# Patient Record
Sex: Female | Born: 1940
Health system: Southern US, Community
[De-identification: ages and names within clinical notes are randomized; demographics above are authoritative.]

---

## 2004-02-03 ENCOUNTER — Inpatient Hospital Stay (HOSPITAL_COMMUNITY): Admission: AC | Admit: 2004-02-03 | Discharge: 2004-02-09 | Payer: Self-pay

## 2004-02-08 ENCOUNTER — Ambulatory Visit: Payer: Self-pay | Admitting: Physical Medicine & Rehabilitation

## 2004-02-09 ENCOUNTER — Inpatient Hospital Stay: Admission: RE | Admit: 2004-02-09 | Discharge: 2004-02-18 | Payer: Self-pay | Admitting: *Deleted

## 2004-02-11 ENCOUNTER — Ambulatory Visit (HOSPITAL_COMMUNITY): Admission: RE | Admit: 2004-02-11 | Discharge: 2004-02-11 | Payer: Self-pay | Admitting: *Deleted

## 2004-04-10 ENCOUNTER — Ambulatory Visit (HOSPITAL_BASED_OUTPATIENT_CLINIC_OR_DEPARTMENT_OTHER): Admission: RE | Admit: 2004-04-10 | Discharge: 2004-04-10 | Payer: Self-pay | Admitting: Specialist

## 2006-10-09 IMAGING — CR DG CHEST 1V PORT
1 series · 1 of 1 positions shown · non-contrast
Comparison: none

CLINICAL DATA: Rib fracture.  
 PORTABLE CHEST ? 02/04/2004 ([DATE] HOURS)
 The heart is normal in size.  The lungs are under inflated.  Patchy airspace disease is seen throughout the left lung and right base versus atelectasis.  No pneumothoraces or effusions are seen.  A right 1st rib fracture is not appreciated.

[view not recorded]
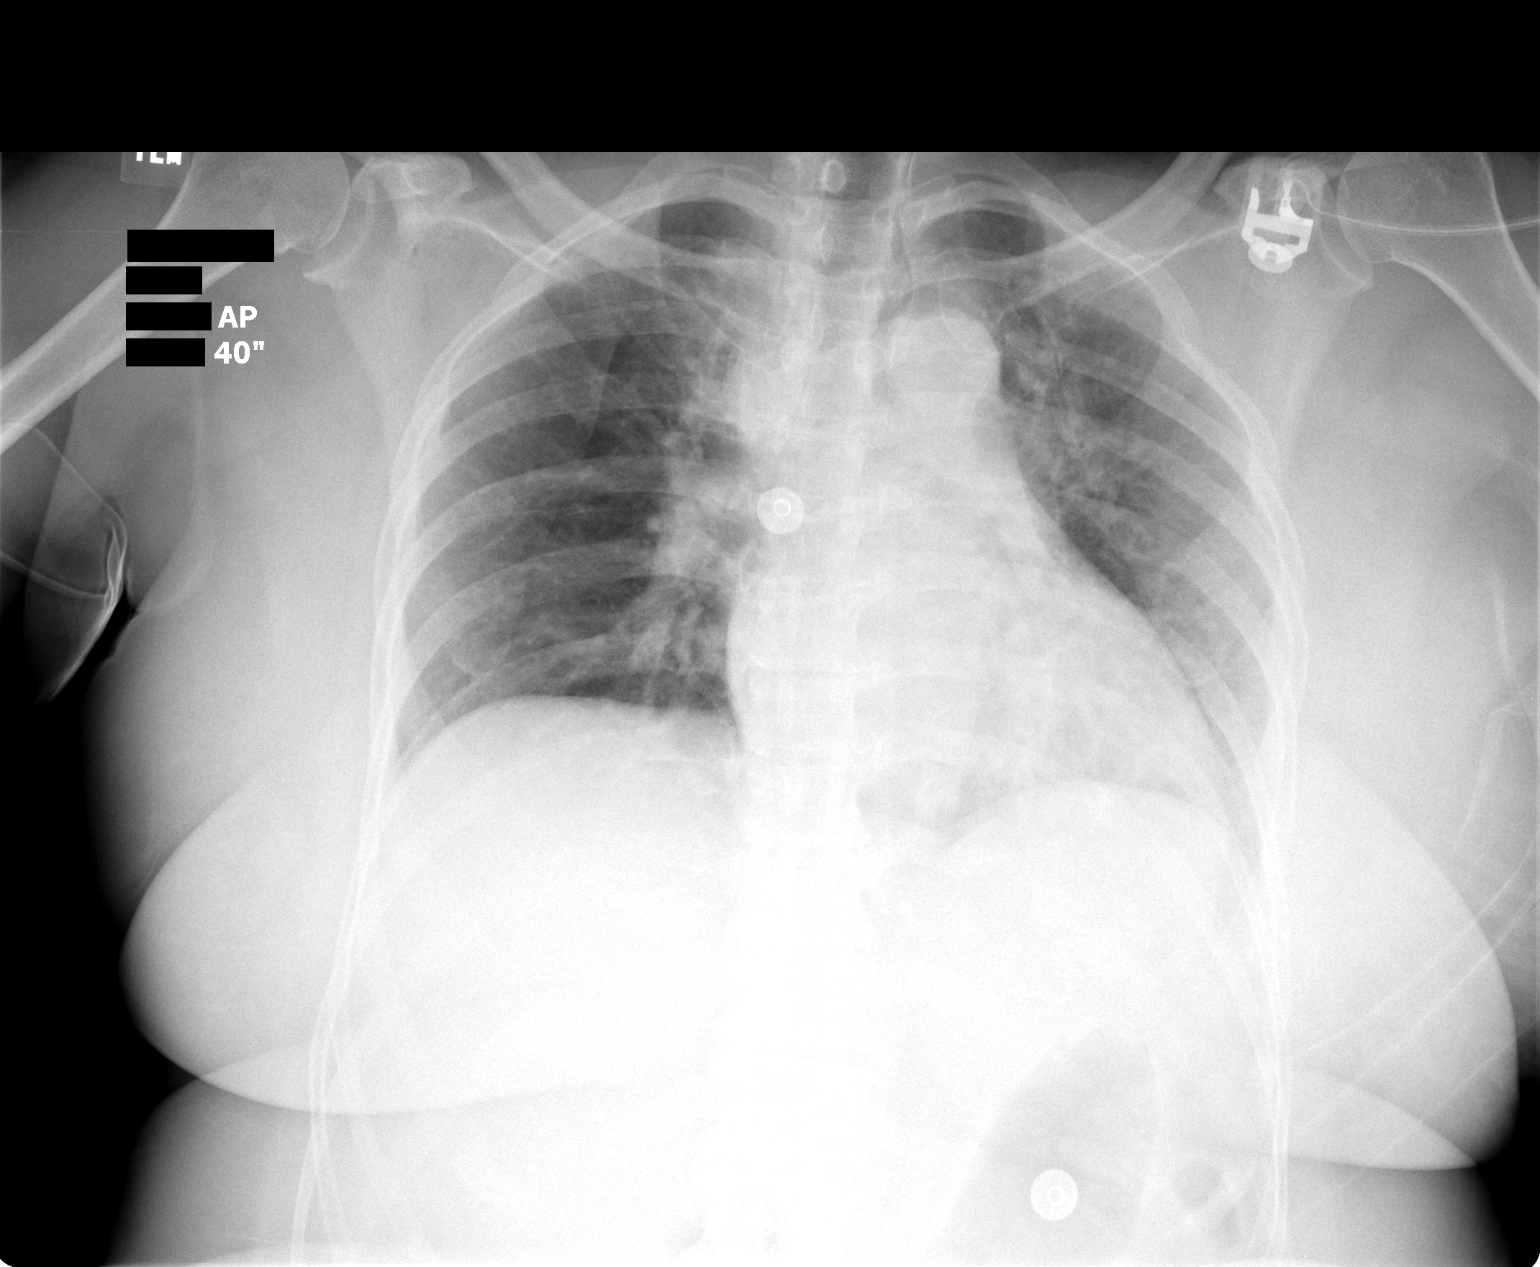

[1 of 1 positions shown; findings below may reference images not displayed]

IMPRESSION: Patchy atelectasis throughout both lungs, left greater than right.

## 2012-08-03 DIAGNOSIS — R0989 Other specified symptoms and signs involving the circulatory and respiratory systems: Secondary | ICD-10-CM

## 2012-08-03 DIAGNOSIS — R0609 Other forms of dyspnea: Secondary | ICD-10-CM

## 2012-10-08 DIAGNOSIS — R0602 Shortness of breath: Secondary | ICD-10-CM

## 2018-08-28 ENCOUNTER — Other Ambulatory Visit: Payer: Self-pay

## 2018-08-28 ENCOUNTER — Ambulatory Visit (INDEPENDENT_AMBULATORY_CARE_PROVIDER_SITE_OTHER): Payer: Medicare Other

## 2018-08-28 ENCOUNTER — Ambulatory Visit (INDEPENDENT_AMBULATORY_CARE_PROVIDER_SITE_OTHER): Payer: Medicare Other | Admitting: Internal Medicine

## 2018-08-28 ENCOUNTER — Encounter: Payer: Self-pay | Admitting: Internal Medicine

## 2018-08-28 DIAGNOSIS — J45991 Cough variant asthma: Secondary | ICD-10-CM

## 2018-08-28 MED ORDER — METHYLPREDNISOLONE ACETATE 80 MG/ML IJ SUSP
120.0000 mg | Freq: Once | INTRAMUSCULAR | Status: AC
Start: 2018-08-28 — End: 2018-08-28
  Administered 2018-08-28: 120 mg via INTRAMUSCULAR

## 2018-08-28 MED ORDER — BUDESONIDE-FORMOTEROL FUMARATE 160-4.5 MCG/ACT IN AERO: 2.0000 | INHALATION_SPRAY | Freq: Two times a day (BID) | RESPIRATORY_TRACT | 0 refills | Status: DC

## 2018-08-28 MED ORDER — PREDNISONE 10 MG PO TABS: ORAL_TABLET | ORAL | 0 refills | Status: DC

## 2018-08-28 MED ORDER — PANTOPRAZOLE SODIUM 40 MG PO TBEC: 40.0000 mg | DELAYED_RELEASE_TABLET | Freq: Every day | ORAL | 2 refills | Status: DC

## 2018-08-28 MED ORDER — FAMOTIDINE 20 MG PO TABS: ORAL_TABLET | ORAL | 11 refills | Status: DC

## 2018-08-28 NOTE — Progress Notes (Signed)
Mary SilvasChannie Haymer, female    DOB: 27-Jan-1941    MRN: [id number]   Brief patient profile:  [age] yobf never smoker with no prior resp problems until [facility] but continued to have spells coughing and sob > admitted over Mother's day 2020again [name] > no better since unless actively  on prednisone with worse w/in a week off  so self referred to pulmonary clinic 08/28/2018      History of Present Illness  08/28/2018  Pulmonary/ 1st office eval/[facility]  Chief Complaint  Patient presents with  . Pulmonary Consult    Self referral for recurrent bronchitis. Pt c/o non prod cough and occ chest congestion.   Dyspnea:  Slow walk anywhere she wants to go when she's not having a flare  Cough: mostly p lies down min prod mucoid  Sleep: can't sleep even in a chair due to chest tight  SABA use: multiple forms hfa and neb qid    No obvious day to day or daytime variability or assoc excess/ purulent sputum or mucus plugs or hemoptysis or cp or chest tightness, subjective wheeze or overt sinus or hb symptoms.     Also denies any obvious fluctuation of symptoms with weather or environmental changes or other aggravating or alleviating factors except as outlined above   No unusual exposure hx or h/o childhood pna/ asthma or knowledge of premature birth.  Current Allergies, Complete Past Medical History, Past Surgical History, Family History, and Social History were reviewed in [facility] electronic medical record.  ROS  The following are not active complaints unless bolded Hoarseness, sore throat, dysphagia, dental problems, itching, sneezing,  nasal congestion or discharge of excess mucus or purulent secretions, ear ache,   fever, chills, sweats, unintended wt loss or wt gain, classically pleuritic or exertional cp,  orthopnea pnd or arm/hand swelling  or leg swelling, presyncope, palpitations, abdominal pain, anorexia, nausea, vomiting, diarrhea  or change in bowel habits or  change in bladder habits, change in stools or change in urine, dysuria, hematuria,  rash, arthralgias, visual complaints, headache, numbness, weakness or ataxia or problems with walking or coordination,  change in mood or  memory.               No past medical history on file.  Outpatient Medications Prior to Visit  Medication Sig Dispense Refill  . albuterol (PROVENTIL) (2.5 MG/3ML) 0.083% nebulizer solution 1 vial in neb 4 x daily as needed    . azithromycin (ZITHROMAX) 250 MG tablet Take 250 mg by mouth daily.        Objective:     BP 126/80 (BP Location: Left Arm, Cuff Size: Normal)   Pulse (!) 109   Temp 98.6 F (37 C) (Oral)   Ht 4\' 10"  (1.473 m)   Wt 129 lb 12.8 oz (58.9 kg)   SpO2 94% Comment: on RA  BMI 27.13 kg/m   SpO2: 94 %(on RA)   Insp/exp rhonchi   amb bf nad   HEENT: nl   turbinates bilaterally, and oropharynx. Nl external ear canals without cough reflex   NECK :  without JVD/Nodes/TM/ nl carotid upstrokes bilaterally   LUNGS: no acc muscle use, mild kyphotic  contour chest  With poor air movement and insp/exp rhonchi bilaterally without cough on insp or exp maneuvers   CV:  RRR  no s3 or murmur or increase in P2, and no edema   ABD:  soft and nontender with nl inspiratory excursion in the  supine position. No bruits or organomegaly appreciated, bowel sounds nl  MS:  Nl gait/ ext warm without deformities, calf tenderness, cyanosis or clubbing No obvious joint restrictions   SKIN: warm and dry without lesions    NEURO:  alert, approp, nl sensorium with  no motor or cerebellar deficits apparent.       CXR PA and Lateral:   08/28/2018 :    I personally reviewed images   impression as follows:  Large HH - no other specific findings   Labs ordered 08/28/2018  Allergy testing.        Assessment   Cough variant asthma Onset 2017 in setting of very large [facility] 08/28/2018  After extensive coaching inhaler device,  effectiveness =    25% try  symbicort 160 2bid and 12 d course of pred/ add max gerd rx  - Allergy profile 08/28/2018 >  Eos 0. /  IgE    Very unusual hx of suddenly developing steroid dep asthma in her 73s  DDX of  difficult airways management almost all start with A and  include Adherence, Ace Inhibitors, Acid Reflux, Active Sinus Disease, Alpha 1 Antitripsin deficiency, Anxiety masquerading as Airways dz,  ABPA,  Allergy(esp in young), Aspiration (esp in elderly), Adverse effects of meds,  Active smoking or vaping, A bunch of PE's (a small clot burden can't cause this syndrome unless there is already severe underlying pulm or vascular dz with poor reserve) plus two Bs  = Bronchiectasis and Beta blocker use..and one C= CHF   Adherence is always the initial "prime suspect" and is a multilayered concern that requires a "trust but verify" approach in every patient - starting with knowing how to use medications, especially inhalers, correctly, keeping up with refills and understanding the fundamental difference between maintenance and prns vs those medications only taken for a very short course and then stopped and not refilled.  - see hfa teaching - return with all meds in hand using a trust but verify approach to confirm accurate Medication  Reconciliation The principal here is that until we are certain that the  patients are doing what we've asked, it makes no sense to ask them to do more.    ? Acid (or non-acid) GERD > always difficult to exclude as up to 75% of pts in some series report no assoc GI/ Heartburn symptoms and she has a huge HH > rec max (24h)  acid suppression and diet restrictions/ reviewed and instructions given in writing.   ? Allergy > send profile, prolong course of pred/ make sure she can use hfa effectively with low threshold to change to neb ics/laba  ? ABPA > send IgE  ? Aspiration > nothing to suggest by hx   ? Adverse drug effects > none listed, denies taking any others   ? chf > nothing to  suggest by cxr or exam      Total time devoted to counseling  > 50 % of initial 60 min office visit:  reviewed case with pt/ Performed device teaching which extended face to face time for this visit discussion of options/alternatives/ personally creating written customized instructions  in presence of pt  then going over those specific  Instructions directly with the pt including how to use all of the meds but in particular covering each new medication in detail and the difference between the maintenance= "automatic" meds and the prns using an action plan format for the latter (If this problem/symptom => do that organization reading  Left to right).  Please see AVS from this visit for a full list of these instructions which I personally wrote for this pt and  are unique to this visit.      Sandrea HughsMichael Ayiana Winslett, MD 08/28/2018

## 2018-08-28 NOTE — Patient Instructions (Addendum)
Prednisone Take 4 for three days 3 for three days 2 for three days 1 for three days and stop   Plan A = Automatic = symbicort 160 Take 2 puffs first thing in am and then another 2 puffs about 12 hours later.   Work on inhaler technique:  relax and gently blow all the way out then take a nice smooth deep breath back in, triggering the inhaler at same time you start breathing in.  Hold for up to 5 seconds if you can. Blow out thru nose. Rinse and gargle with water when done     Plan B = Backup Only use your albuterol (Proair)inhaler as a rescue medication to be used if you can't catch your breath by resting or doing a relaxed purse lip breathing pattern.  - The less you use it, the better it will work when you need it. - Ok to use the inhaler up to 2 puffs  every 4 hours if you must but call for appointment if use goes up over your usual need - Don't leave home without it !!  (think of it like the spare tire for your car)   Plan C = Crisis - only use your albuterol nebulizer if you first try Plan B and it fails to help > ok to use the nebulizer up to every 4 hours but if start needing it regularly call for immediate appointment   Please remember to go to the lab and x-ray department   for your tests - we will call you with the results when they are available.     Please schedule a follow up office visit in 4 weeks, sooner if needed  with all medications /inhalers/ solutions in hand so we can verify exactly what you are taking. This includes all medications from all doctors and over the counters Add:  Pantoprazole (protonix) 40 mg   Take  30-60 min before first meal of the day and Pepcid (famotidine)  20 mg one @  bedtime until return to office - this is the best way to tell whether stomach acid is contributing to your problem.

## 2018-08-29 ENCOUNTER — Encounter: Payer: Self-pay | Admitting: Internal Medicine

## 2018-08-29 ENCOUNTER — Telehealth: Payer: Self-pay | Admitting: *Deleted

## 2018-08-29 LAB — CBC WITH DIFFERENTIAL/PLATELET
Basophils Absolute: 0.1 10*3/uL (ref 0.0–0.1)
Basophils Relative: 0.9 % (ref 0.0–3.0)
Eosinophils Absolute: 0.7 10*3/uL (ref 0.0–0.7)
Eosinophils Relative: 6.9 % — ABNORMAL HIGH (ref 0.0–5.0)
HCT: 43.9 % (ref 36.0–46.0)
Hemoglobin: 14.5 g/dL (ref 12.0–15.0)
Lymphocytes Relative: 19.4 % (ref 12.0–46.0)
Lymphs Abs: 1.9 10*3/uL (ref 0.7–4.0)
MCHC: 33 g/dL (ref 30.0–36.0)
MCV: 89.2 fl (ref 78.0–100.0)
Monocytes Absolute: 0.6 10*3/uL (ref 0.1–1.0)
Monocytes Relative: 6.3 % (ref 3.0–12.0)
Neutro Abs: 6.6 10*3/uL (ref 1.4–7.7)
Neutrophils Relative %: 66.5 % (ref 43.0–77.0)
Platelets: 250 10*3/uL (ref 150.0–400.0)
RBC: 4.92 Mil/uL (ref 3.87–5.11)
RDW: 13.3 % (ref 11.5–15.5)
WBC: 9.9 10*3/uL (ref 4.0–10.5)

## 2018-08-29 LAB — RESPIRATORY ALLERGY PROFILE REGION II ~~LOC~~

## 2018-08-29 LAB — INTERPRETATION:

## 2018-08-29 NOTE — Telephone Encounter (Signed)
Received d/c summary and given to [facility]

## 2018-08-29 NOTE — Progress Notes (Signed)
Spoke with pt and notified of results per Dr. Wert. Pt verbalized understanding and denied any questions. 

## 2018-08-29 NOTE — Telephone Encounter (Signed)
Pt aware of PPI recs  I have sent fax requesting her d/c summary   Will await fax

## 2018-08-29 NOTE — Assessment & Plan Note (Addendum)
Onset 2017 in setting of very large [facility] 08/28/2018  After extensive coaching inhaler device,  effectiveness =    25% try symbicort 160 2bid and 12 d course of pred/ add max gerd rx  - Allergy profile 08/28/2018 >  Eos 0. /  IgE    Very unusual hx of suddenly developing steroid dep asthma in her 16s  DDX of  difficult airways management almost all start with A and  include Adherence, Ace Inhibitors, Acid Reflux, Active Sinus Disease, Alpha 1 Antitripsin deficiency, Anxiety masquerading as Airways dz,  ABPA,  Allergy(esp in young), Aspiration (esp in elderly), Adverse effects of meds,  Active smoking or vaping, A bunch of PE's (a small clot burden can't cause this syndrome unless there is already severe underlying pulm or vascular dz with poor reserve) plus two Bs  = Bronchiectasis and Beta blocker use..and one C= CHF   Adherence is always the initial "prime suspect" and is a multilayered concern that requires a "trust but verify" approach in every patient - starting with knowing how to use medications, especially inhalers, correctly, keeping up with refills and understanding the fundamental difference between maintenance and prns vs those medications only taken for a very short course and then stopped and not refilled.  - see hfa teaching - return with all meds in hand using a trust but verify approach to confirm accurate Medication  Reconciliation The principal here is that until we are certain that the  patients are doing what we've asked, it makes no sense to ask them to do more.    ? Acid (or non-acid) GERD > always difficult to exclude as up to 75% of pts in some series report no assoc GI/ Heartburn symptoms and she has a huge HH > rec max (24h)  acid suppression and diet restrictions/ reviewed and instructions given in writing.   ? Allergy > send profile, prolong course of pred/ make sure she can use hfa effectively with low threshold to change to neb ics/laba  ? ABPA > send IgE  ? Aspiration >  nothing to suggest by hx   ? Adverse drug effects > none listed, denies taking any others   ? chf > nothing to suggest by cxr or exam   Total time devoted to counseling  > 50 % of initial 60 min office visit:  reviewed case with pt/ Performed device teaching which extended face to face time for this visit discussion of options/alternatives/ personally creating written customized instructions  in presence of pt  then going over those specific  Instructions directly with the pt including how to use all of the meds but in particular covering each new medication in detail and the difference between the maintenance= "automatic" meds and the prns using an action plan format for the latter (If this problem/symptom => do that organization reading Left to right).  Please see AVS from this visit for a full list of these instructions which I personally wrote for this pt and  are unique to this visit.

## 2018-08-29 NOTE — Telephone Encounter (Signed)
-----   Message from Tanda Rockers, MD sent at 08/29/2018  6:10 AM EDT ----- Needs last d/c summary from [facility] and I forgot to tell her I called in :    Pantoprazole (protonix) 40 mg   Take  30-60 min before first meal of the day and Pepcid (famotidine)  20 mg one @  bedtime until return to office - this is the best way to tell whether stomach acid is contributing to your problem.

## 2018-09-24 ENCOUNTER — Other Ambulatory Visit: Payer: Self-pay

## 2018-09-24 ENCOUNTER — Encounter: Payer: Self-pay | Admitting: Internal Medicine

## 2018-09-24 ENCOUNTER — Ambulatory Visit (INDEPENDENT_AMBULATORY_CARE_PROVIDER_SITE_OTHER): Payer: Medicare Other | Admitting: Internal Medicine

## 2018-09-24 DIAGNOSIS — J45991 Cough variant asthma: Secondary | ICD-10-CM | POA: Diagnosis not present

## 2018-09-24 MED ORDER — BUDESONIDE-FORMOTEROL FUMARATE 160-4.5 MCG/ACT IN AERO: 2.0000 | INHALATION_SPRAY | Freq: Two times a day (BID) | RESPIRATORY_TRACT | 11 refills | Status: DC

## 2018-09-24 MED ORDER — PREDNISONE 10 MG PO TABS: ORAL_TABLET | ORAL | 0 refills | Status: DC

## 2018-09-24 NOTE — Progress Notes (Signed)
Mary Solis, female    DOB: 1940-10-14    MRN: [id number]   Brief patient profile:  39 yobf never smoker with no prior resp problems until admit [facility] 2017 multiple times but continued to have spells coughing and sob > admitted over Mother's day 2020 again [name] > no better since unless actively  on prednisone with worse w/in a week off  so self referred to pulmonary clinic 08/28/2018      History of Present Illness  08/28/2018  Pulmonary/ 1st office eval/[facility]  Chief Complaint  Patient presents with  . Pulmonary Consult    Self referral for recurrent bronchitis. Pt c/o non prod cough and occ chest congestion.   Dyspnea:  Slow walk anywhere she wants to go when she's not having a flare  Cough: mostly p lies down min prod mucoid  Sleep: can't sleep even in a chair due to chest tight  SABA use: multiple forms hfa and neb qid  rec Prednisone Take 4 for three days 3 for three days 2 for three days 1 for three days and stop  Plan A = Automatic = symbicort 160 Take 2 puffs first thing in am and then another 2 puffs about 12 hours later.  Work on inhaler technique:    Plan B = Backup Only use your albuterol (Proair)inhaler as a rescue medication  Plan C = Crisis - only use your albuterol nebulizer if you first try Plan B and it fails to help > ok to use the nebulizer up to every 4 hours but if start needing it regularly call for immediate appointment Please remember to go to the lab and x-ray department   for your tests - we will call you with the results when they are available.       09/24/2018  f/u ov/Wert re: dtc asthma / steroid responsive/ did not take symbicort correctly  Chief Complaint  Patient presents with  . Follow-up    Breathing is doing well and she has not need her neb.    Dyspnea:  Can  Now walk to son's house [place] s stopping or walk the neighborhood x 10 min  Cough: none  Sleeping: sleeps flat  SABA use: none  02: none    No obvious day to day or  daytime variability or assoc excess/ purulent sputum or mucus plugs or hemoptysis or cp or chest tightness, subjective wheeze or overt sinus or hb symptoms.   Sleeping ok  without nocturnal  or early am exacerbation  of respiratory  c/o's or need for noct saba. Also denies any obvious fluctuation of symptoms with weather or environmental changes or other aggravating or alleviating factors except as outlined above   No unusual exposure hx or h/o childhood pna/ asthma or knowledge of premature birth.  Current Allergies, Complete Past Medical History, Past Surgical History, Family History, and Social History were reviewed in [employer] record.  ROS  The following are not active complaints unless bolded Hoarseness, sore throat, dysphagia, dental problems, itching, sneezing,  nasal congestion or discharge of excess mucus or purulent secretions, ear ache,   fever, chills, sweats, unintended wt loss or wt gain, classically pleuritic or exertional cp,  orthopnea pnd or arm/hand swelling  or leg swelling, presyncope, palpitations, abdominal pain, anorexia, nausea, vomiting, diarrhea  or change in bowel habits or change in bladder habits, change in stools or change in urine, dysuria, hematuria,  rash, arthralgias, visual complaints, headache, numbness, weakness or ataxia or  problems with walking or coordination,  change in mood or  memory.        Current Meds  Medication Sig  . albuterol (PROVENTIL) (2.5 MG/3ML) 0.083% nebulizer solution 1 vial in neb 4 x daily as needed  . budesonide-formoterol (SYMBICORT) 160-4.5 MCG/ACT inhaler Not taking   . famotidine (PEPCID) 20 MG tablet One after supper  . pantoprazole (PROTONIX) 40 MG tablet Take 1 tablet (40 mg total) by mouth daily. Take 30-60 min before first meal of the day                          Objective:     Wt Readings from Last 3 Encounters:  09/24/18 136 lb (61.7 kg)  08/28/18 129 lb 12.8 oz (58.9 kg)      Vital signs reviewed - Note on arrival 02 sats  97% on RA     HEENT: nl dentition, turbinates bilaterally, and oropharynx. Nl external ear canals without cough reflex   NECK :  without JVD/Nodes/TM/ nl carotid upstrokes bilaterally   LUNGS: no acc muscle use,  Nl contour chest  With minimal insp/ exp rhonchi  bilaterally without cough on insp or exp maneuvers   CV:  RRR  no s3 or murmur or increase in P2, and no edema   ABD:  soft and nontender with nl inspiratory excursion in the supine position. No bruits or organomegaly appreciated, bowel sounds nl  MS:  Nl gait/ ext warm without deformities, calf tenderness, cyanosis or clubbing No obvious joint restrictions   SKIN: warm and dry without lesions    NEURO:  alert, approp, nl sensorium with  no motor or cerebellar deficits apparent.           Assessment

## 2018-09-24 NOTE — Patient Instructions (Addendum)
If any problem at all with your breathing or coughing take symbicort 160 up to 2 puffs every 12 hours   Work on inhaler technique:  relax and gently blow all the way out then take a nice smooth deep breath back in, triggering the inhaler at same time you start breathing in.  Hold for up to 5 seconds if you can. Blow out thru nose. Rinse and gargle with water when done  Only use your albuterol as a rescue medication to be used if you can't catch your breath by resting or doing a relaxed purse lip breathing pattern.  - The less you use it, the better it will work when you need it. - Ok to use up to 2 puffs  every 4 hours if you must but call for immediate appointment if use goes up over your usual need - Don't leave home without it !!  (think of it like the spare tire for your car)   Continue Pantoprazole (protonix) 40 mg   Take  30-60 min before first meal of the day and Pepcid (famotidine)  20 mg one after supper  until return to office - this is the best way to tell whether stomach acid is contributing to your problem.     If getting much worse >  Prednisone 10 mg take  4 each am x 2 days,   2 each am x 2 days,  1 each am x 2 days and stop   Please schedule a follow up office visit in 6 weeks, call sooner if needed with all medications /inhalers/ solutions in hand so we can verify exactly what you are taking. This includes all medications from all doctors and over the counters

## 2018-09-25 ENCOUNTER — Encounter: Payer: Self-pay | Admitting: Internal Medicine

## 2018-09-25 NOTE — Assessment & Plan Note (Signed)
Onset 2017 in setting of very large [facility] 08/28/2018    try symbicort 160 2bid and 12 d course of pred/ add max gerd rx  - Allergy profile 08/28/2018 >  Eos 0.7 /  IgE  75  RAST neg - 09/24/2018  After extensive coaching inhaler device,  effectiveness =    50%    She has a very striking response to prednisone and probably would do fine if she could master HFA and take Symbicort more consistently.  On the other hand, she also has a large hiatal hernia and many of her symptoms may be related to reflux and improving with treatment for this.  I therefore emphasized continue treatment for reflux aggressively and if worsening can add back Symbicort and then short course of prednisone.  If she continues to flare off of prednisone then we need to consider either adding Singulair or switching to a different form of inhaled steroid such as a nebulizer as DPI is level to make her cough worse..   I had an extended discussion with the patient reviewing all relevant studies completed to date and  lasting 15 to 20 minutes of a 25 minute visit    I performed detailed device teaching using a teach back method which extended face to face time for this visit (see above)  Each maintenance medication was reviewed in detail including emphasizing most importantly the difference between maintenance and prns and under what circumstances the prns are to be triggered using an action plan format that is not reflected in the computer generated alphabetically organized AVS which I have not found useful in most complex patients, especially with respiratory illnesses  Please see AVS for specific instructions unique to this visit that I personally wrote and verbalized to the the pt in detail and then reviewed with pt  by my nurse highlighting any  changes in therapy recommended at today's visit to their plan of care.

## 2018-09-26 ENCOUNTER — Telehealth: Payer: Self-pay | Admitting: Internal Medicine

## 2018-09-26 MED ORDER — PANTOPRAZOLE SODIUM 40 MG PO TBEC: 40.0000 mg | DELAYED_RELEASE_TABLET | Freq: Every day | ORAL | 1 refills | Status: DC

## 2018-09-26 NOTE — Telephone Encounter (Signed)
Pt returning call.Mary Solis ° °

## 2018-09-26 NOTE — Telephone Encounter (Signed)
Call returned to patient, requesting refill of protonix. Confirmed medication and pharmacy. Refill sent. Nothing further needed at this time.

## 2018-09-26 NOTE — Telephone Encounter (Signed)
Instructions from OV with MW 09/24/2018  If any problem at all with your breathing or coughing take symbicort 160 up to 2 puffs every 12 hours   Work on inhaler technique:  relax and gently blow all the way out then take a nice smooth deep breath back in, triggering the inhaler at same time you start breathing in.  Hold for up to 5 seconds if you can. Blow out thru nose. Rinse and gargle with water when done  Only use your albuterol as a rescue medication to be used if you can't catch your breath by resting or doing a relaxed purse lip breathing pattern.  - The less you use it, the better it will work when you need it. - Ok to use up to 2 puffs  every 4 hours if you must but call for immediate appointment if use goes up over your usual need - Don't leave home without it !!  (think of it like the spare tire for your car)   Continue Pantoprazole (protonix) 40 mg   Take  30-60 min before first meal of the day and Pepcid (famotidine)  20 mg one after supper  until return to office - this is the best way to tell whether stomach acid is contributing to your problem.     If getting much worse >  Prednisone 10 mg take  4 each am x 2 days,   2 each am x 2 days,  1 each am x 2 days and stop   Please schedule a follow up office visit in 6 weeks, call sooner if needed with all medications /inhalers/ solutions in hand so we can verify exactly what you are taking. This includes all medications from all doctors and over the counters      Attempted to call pt but unable to reach. Left message for pt to return call.

## 2018-11-05 ENCOUNTER — Encounter: Payer: Self-pay | Admitting: Internal Medicine

## 2018-11-05 ENCOUNTER — Other Ambulatory Visit: Payer: Self-pay

## 2018-11-05 ENCOUNTER — Ambulatory Visit (INDEPENDENT_AMBULATORY_CARE_PROVIDER_SITE_OTHER): Payer: Medicare Other | Admitting: Internal Medicine

## 2018-11-05 DIAGNOSIS — J45991 Cough variant asthma: Secondary | ICD-10-CM

## 2018-11-05 MED ORDER — ALBUTEROL SULFATE (2.5 MG/3ML) 0.083% IN NEBU: INHALATION_SOLUTION | RESPIRATORY_TRACT | 11 refills | Status: AC

## 2018-11-05 MED ORDER — ALBUTEROL SULFATE HFA 108 (90 BASE) MCG/ACT IN AERS: INHALATION_SPRAY | RESPIRATORY_TRACT | 11 refills | Status: DC

## 2018-11-05 MED ORDER — PREDNISONE 10 MG PO TABS: ORAL_TABLET | ORAL | 0 refills | Status: DC

## 2018-11-05 MED ORDER — IPRATROPIUM-ALBUTEROL 0.5-2.5 (3) MG/3ML IN SOLN: 3.0000 mL | RESPIRATORY_TRACT | 11 refills | Status: DC | PRN

## 2018-11-05 NOTE — Patient Instructions (Signed)
Plan A = Automatic = Always=    symbicort 160 Take 2 puffs first thing in am and then another 2 puffs about 12 hours later.   Work on inhaler technique:  relax and gently blow all the way out then take a nice smooth deep breath back in, triggering the inhaler at same time you start breathing in.  Hold for up to 5 seconds if you can. Blow out thru nose. Rinse and gargle with water when done      Plan B = Backup (to supplement plan A, not to replace it) Only use your albuterol neb  as a rescue medication to be used if you can't catch your breath by resting or doing a relaxed purse lip breathing pattern.  - The less you use it, the better it will work when you need it. - Ok to use the  Up to  every 4 hours if you must but call for appointment if use goes up over your usual need - Don't leave home without it !!  (think of it like the spare tire for your car)     If breathing getting worse despite the above > Prednisone 10 mg take  4 each am x 2 days,   2 each am x 2 days,  1 each am x 2 days and stop   Please schedule a follow up office visit in 4 weeks, sooner if needed  with all medications /inhalers/ solutions in hand so we can verify exactly what you are taking. This includes all medications from all doctors and over the counters

## 2018-11-05 NOTE — Progress Notes (Addendum)
Mary Solis, female    DOB: 12/03/1940    MRN: [id number]   Brief patient profile:  46 yobf never smoker with no prior resp problems until admit [facility] 2017 multiple times but continued to have spells coughing and sob > admitted over Mother's day 2020 again [name] > no better since unless actively  on prednisone with worse w/in a week off  so self referred to pulmonary clinic 08/28/2018      History of Present Illness  08/28/2018  Pulmonary/ 1st office eval/Mary Solis  Chief Complaint  Patient presents with  . Pulmonary Consult    Self referral for recurrent bronchitis. Pt c/o non prod cough and occ chest congestion.   Dyspnea:  Slow walk anywhere she wants to go when she's not having a flare  Cough: mostly p lies down min prod mucoid  Sleep: can't sleep even in a chair due to chest tight  SABA use: multiple forms hfa and neb qid  rec Prednisone Take 4 for three days 3 for three days 2 for three days 1 for three days and stop  Plan A = Automatic = symbicort 160 Take 2 puffs first thing in am and then another 2 puffs about 12 hours later.  Work on inhaler technique:    Plan B = Backup Only use your albuterol (Proair)inhaler as a rescue medication  Plan C = Crisis - only use your albuterol nebulizer if you first try Plan B and it fails to help > ok to use the nebulizer up to every 4 hours but if start needing it regularly call for immediate appointment Please remember to go to the lab and x-ray department   for your tests - we will call you with the results when they are available.       09/24/2018  f/u ov/Mary Solis re: dtc asthma / steroid responsive/ did not take symbicort correctly  Chief Complaint  Patient presents with  . Follow-up    Breathing is doing well and she has not need her neb.   Dyspnea:  Can  Now walk to son's house up hill s stopping or walk the neighborhood x 10 min  Cough: none  Sleeping: sleeps flat  SABA use: none  02: none  rec If any problem at all with  your breathing or coughing take symbicort 160 up to 2 puffs every 12 hours  Work on inhaler technique:   Only use your albuterol as a rescue Continue Pantoprazole (protonix) 40 mg   Take  30-60 min before first meal of the day and Pepcid (famotidine)  20 mg one after supper   If getting much worse >  Prednisone 10 mg take  4 each am x 2 days,   2 each am x 2 days,  1 each am x 2 days and stop  Please schedule a follow up office visit in 6 weeks, call sooner if needed with all medications /inhalers/ solutions in hand so we can verify exactly what you are taking. This includes all medications from all doctors and over the counters      11/05/2018  f/u ov/Mary Solis re: s/p admit [facility] with asthma, not using symb approp  Chief Complaint  Patient presents with  . Follow-up    Pt states had to be admitted to [facility] 10-20-18-10-24-18 for bronchitis. She has been using her neb bid since she was d/c'ed. She states "I feel fine" and denies any cough, wheezing or SOB. She c/o diarrhea and stomach upset that she relates  to recent steroids.   started worse 10/19/2018 and so started back on symb 160 and then prednisone next day and still ended up in hosp Dyspnea:  Back walking to son's house  Cough: none Sleeping:  Fine  SABA use: neb  Bid  02: none   No obvious day to day or daytime variability or assoc excess/ purulent sputum or mucus plugs or hemoptysis or cp or chest tightness, subjective wheeze or overt sinus or hb symptoms.   Sleeping now  without nocturnal  or early am exacerbation  of respiratory  c/o's or need for noct saba. Also denies any obvious fluctuation of symptoms with weather or environmental changes or other aggravating or alleviating factors except as outlined above   No unusual exposure hx or h/o childhood pna/ asthma or knowledge of premature birth.  Current Allergies, Complete Past Medical History, Past Surgical History, Family History, and Social History were reviewed in [facility] record.  ROS  The following are not active complaints unless bolded Hoarseness, sore throat, dysphagia, dental problems, itching, sneezing,  nasal congestion or discharge of excess mucus or purulent secretions, ear ache,   fever, chills, sweats, unintended wt loss or wt gain, classically pleuritic or exertional cp,  orthopnea pnd or arm/hand swelling  or leg swelling, presyncope, palpitations, abdominal pain, anorexia, nausea, vomiting, diarrhea  or change in bowel habits or change in bladder habits, change in stools or change in urine, dysuria, hematuria,  rash, arthralgias, visual complaints, headache, numbness, weakness or ataxia or problems with walking or coordination,  change in mood or  memory.        Current Meds - - NOTE:   Unable to verify as accurately reflecting what pt takes     Medication Sig  . budesonide-formoterol (SYMBICORT) 160-4.5 MCG/ACT inhaler Inhale 2 puffs into the lungs 2 (two) times daily.  . famotidine (PEPCID) 20 MG tablet One after supper  . pantoprazole (PROTONIX) 40 MG tablet Take 1 tablet (40 mg total) by mouth daily. Take 30-60 min before first meal of the day  . [DISCONTINUED] albuterol (PROVENTIL) (2.5 MG/3ML) 0.083% nebulizer solution 1 vial in neb 4 x daily as needed                     Objective:     11/05/2018        137   09/24/18 136 lb (61.7 kg)  08/28/18 129 lb 12.8 oz (58.9 kg)    amb elderly  bf nad  Vital signs reviewed - Note on arrival 02 sats  98% on RA       HEENT : pt wearing mask not removed for exam due to covid -19 concerns.    NECK :  without JVD/Nodes/TM/ nl carotid upstrokes bilaterally   LUNGS: no acc muscle use,  Nl contour chest which is clear to A and P bilaterally without cough on insp or exp maneuvers   CV:  RRR  no s3 or murmur or increase in P2, and no edema   ABD:  soft and nontender with nl inspiratory excursion in the supine position. No bruits or organomegaly appreciated, bowel  sounds nl  MS:  Nl gait/ ext warm without deformities, calf tenderness, cyanosis or clubbing No obvious joint restrictions   SKIN: warm and dry without lesions    NEURO:  alert, approp, nl sensorium with  no motor or cerebellar deficits apparent.             Assessment

## 2018-11-06 ENCOUNTER — Encounter: Payer: Self-pay | Admitting: Internal Medicine

## 2018-11-06 NOTE — Assessment & Plan Note (Addendum)
Onset 2017 in setting of very large [facility] 08/28/2018    try symbicort 160 2bid and 12 d course of pred/ add max gerd rx  - Allergy profile 08/28/2018 >  Eos 0.7 /  IgE  75  RAST neg - 11/05/2018  After extensive coaching inhaler device,  effectiveness =    25%      Apparent exac of asthma after she misunderstood action plan given at last ov so rec  1) symbicort 160 2bid and work on hfa, if not able to do better may try dpi/elipta next - not a good candidate for neb laba/ics  as insurance doesn't cover  2) continue gerd rx   Return 4 weeks  with all meds in hand using a trust but verify approach to confirm accurate Medication  Reconciliation The principal here is that until we are certain that the  patients are doing what we've asked, it makes no sense to ask them to do more.    I had an extended discussion with the patient reviewing all relevant studies completed to date and  lasting 15 to 20 minutes of a 25 minute visit    I performed detailed device teaching using a teach back method which extended face to face time for this visit (see above)  Each maintenance medication was reviewed in detail including emphasizing most importantly the difference between maintenance and prns and under what circumstances the prns are to be triggered using an action plan format that is not reflected in the computer generated alphabetically organized AVS which I have not found useful in most complex patients, especially with respiratory illnesses  Please see AVS for specific instructions unique to this visit that I personally wrote and verbalized to the the pt in detail and then reviewed with pt  by my nurse highlighting any  changes in therapy recommended at today's visit to their plan of care.

## 2018-12-03 ENCOUNTER — Ambulatory Visit: Payer: Medicare Other | Admitting: Internal Medicine

## 2018-12-09 ENCOUNTER — Ambulatory Visit: Payer: Medicare Other | Admitting: Internal Medicine

## 2018-12-10 ENCOUNTER — Encounter: Payer: Self-pay | Admitting: Internal Medicine

## 2018-12-10 ENCOUNTER — Ambulatory Visit (INDEPENDENT_AMBULATORY_CARE_PROVIDER_SITE_OTHER): Payer: Medicare Other | Admitting: Internal Medicine

## 2018-12-10 ENCOUNTER — Other Ambulatory Visit: Payer: Self-pay

## 2018-12-10 DIAGNOSIS — J45991 Cough variant asthma: Secondary | ICD-10-CM | POA: Diagnosis not present

## 2018-12-10 MED ORDER — PREDNISONE 10 MG PO TABS: ORAL_TABLET | ORAL | 0 refills | Status: DC

## 2018-12-10 MED ORDER — BUDESONIDE-FORMOTEROL FUMARATE 160-4.5 MCG/ACT IN AERO: 2.0000 | INHALATION_SPRAY | Freq: Two times a day (BID) | RESPIRATORY_TRACT | 11 refills | Status: DC

## 2018-12-10 NOTE — Assessment & Plan Note (Signed)
Onset 2017 in setting of very large [facility] 08/28/2018    try symbicort 160 2bid and 12 d course of pred/ add max gerd rx  - Allergy profile 08/28/2018 >  Eos 0.7 /  IgE  75  RAST neg     - 12/10/2018  After extensive coaching inhaler device,  effectiveness =    25%  > continue symb 160 2bid back up with neb saba an prednisone if flares    Her problem is poor understanding on insp/exp so blows out as often as she breathes in using hfa and if does this with dpi half the powder will blow out in the wrong direction  Her insurance won't pan the 20% for performist but would pay for pulmocort 0.5 each am so will try this strategy and f/u in 4 weeks to regroup with all meds in hand using a trust but verify approach to confirm accurate Medication  Reconciliation The principal here is that until we are certain that the  patients are doing what we've asked, it makes no sense to ask them to do more.    I had an extended discussion with the patient reviewing all relevant studies completed to date and  lasting 15 to 20 minutes of a 25 minute visit    I performed detailed device teaching using a teach back method which extended face to face time for this visit (see above)  Each maintenance medication was reviewed in detail including emphasizing most importantly the difference between maintenance and prns and under what circumstances the prns are to be triggered using an action plan format that is not reflected in the computer generated alphabetically organized AVS which I have not found useful in most complex patients, especially with respiratory illnesses  Please see AVS for specific instructions unique to this visit that I personally wrote and verbalized to the the pt in detail and then reviewed with pt  by my nurse highlighting any  changes in therapy recommended at today's visit to their plan of care.

## 2018-12-10 NOTE — Progress Notes (Signed)
Mary Solis, female    DOB: 12/03/1940    MRN: [id number]   Brief patient profile:  46 yobf never smoker with no prior resp problems until admit [facility] 2017 multiple times but continued to have spells coughing and sob > admitted over Mother's day 2020 again [name] > no better since unless actively  on prednisone with worse w/in a week off  so self referred to pulmonary clinic 08/28/2018      History of Present Illness  08/28/2018  Pulmonary/ 1st office eval/Mary Solis  Chief Complaint  Patient presents with  . Pulmonary Consult    Self referral for recurrent bronchitis. Pt c/o non prod cough and occ chest congestion.   Dyspnea:  Slow walk anywhere she wants to go when she's not having a flare  Cough: mostly p lies down min prod mucoid  Sleep: can't sleep even in a chair due to chest tight  SABA use: multiple forms hfa and neb qid  rec Prednisone Take 4 for three days 3 for three days 2 for three days 1 for three days and stop  Plan A = Automatic = symbicort 160 Take 2 puffs first thing in am and then another 2 puffs about 12 hours later.  Work on inhaler technique:    Plan B = Backup Only use your albuterol (Proair)inhaler as a rescue medication  Plan C = Crisis - only use your albuterol nebulizer if you first try Plan B and it fails to help > ok to use the nebulizer up to every 4 hours but if start needing it regularly call for immediate appointment Please remember to go to the lab and x-ray department   for your tests - we will call you with the results when they are available.       09/24/2018  f/u ov/Mary Solis re: dtc asthma / steroid responsive/ did not take symbicort correctly  Chief Complaint  Patient presents with  . Follow-up    Breathing is doing well and she has not need her neb.   Dyspnea:  Can  Now walk to son's house [place] s stopping or walk the neighborhood x 10 min  Cough: none  Sleeping: sleeps flat  SABA use: none  02: none  rec If any problem at all with  your breathing or coughing take symbicort 160 up to 2 puffs every 12 hours  Work on inhaler technique:   Only use your albuterol as a rescue Continue Pantoprazole (protonix) 40 mg   Take  30-60 min before first meal of the day and Pepcid (famotidine)  20 mg one after supper   If getting much worse >  Prednisone 10 mg take  4 each am x 2 days,   2 each am x 2 days,  1 each am x 2 days and stop  Please schedule a follow up office visit in 6 weeks, call sooner if needed with all medications /inhalers/ solutions in hand so we can verify exactly what you are taking. This includes all medications from all doctors and over the counters      11/05/2018  f/u ov/Mary Solis re: s/p admit [facility] with asthma, not using symb approp  Chief Complaint  Patient presents with  . Follow-up    Pt states had to be admitted to [facility] 10-20-18-10-24-18 for bronchitis. She has been using her neb bid since she was d/c'ed. She states "I feel fine" and denies any cough, wheezing or SOB. She c/o diarrhea and stomach upset that she relates  to recent steroids.   started worse 10/19/2018 and so started back on symb 160 and then prednisone next day and still ended up in hosp Dyspnea:  Back walking to son's house  Cough: none Sleeping:  Fine  SABA use: neb  Bid  02: none rec Plan A = Automatic = Always=    symbicort 160 Take 2 puffs first thing in am and then another 2 puffs about 12 hours later.  Work on inhaler technique:    Plan B = Backup (to supplement plan A, not to replace it) Only use your albuterol neb  as a rescue medication If breathing getting worse despite the above > Prednisone 10 mg take  4 each am x 2 days,   2 each am x 2 days,  1 each am x 2 days and stop  Please schedule a follow up office visit in 4 weeks, sooner if needed  with all medications /inhalers/ solutions in hand so we can verify exactly what you are taking. This includes all medications from all doctors and over the counters    12/10/2018  f/u ov/Mary Solis  re: dtc asthma on symb 160 2bid/ pred as backup  Chief Complaint  Patient presents with  . Follow-up    Breathing has been doing "wonderful" and she has not been using her neb.    Dyspnea:  Back to  Walking to son's house  Cough: none Sleeping: ok flat SABA use: not lately 02: none    No obvious day to day or daytime variability or assoc excess/ purulent sputum or mucus plugs or hemoptysis or cp or chest tightness, subjective wheeze or overt sinus or hb symptoms.   Sleeping  without nocturnal  or early am exacerbation  of respiratory  c/o's or need for noct saba. Also denies any obvious fluctuation of symptoms with weather or environmental changes or other aggravating or alleviating factors except as outlined above   No unusual exposure hx or h/o childhood pna/ asthma or knowledge of premature birth.  Current Allergies, Complete Past Medical History, Past Surgical History, Family History, and Social History were reviewed in [employer] record.  ROS  The following are not active complaints unless bolded Hoarseness, sore throat, dysphagia, dental problems, itching, sneezing,  nasal congestion or discharge of excess mucus or purulent secretions, ear ache,   fever, chills, sweats, unintended wt loss or wt gain, classically pleuritic or exertional cp,  orthopnea pnd or arm/hand swelling  or leg swelling, presyncope, palpitations, abdominal pain, anorexia, nausea, vomiting, diarrhea  or change in bowel habits or change in bladder habits, change in stools or change in urine, dysuria, hematuria,  rash, arthralgias, visual complaints, headache, numbness, weakness or ataxia or problems with walking or coordination,  change in mood or  memory.        Current Meds  Medication Sig  . albuterol (PROVENTIL) (2.5 MG/3ML) 0.083% nebulizer solution Use up  to every 4 hours if needed  . budesonide-formoterol (SYMBICORT) 160-4.5 MCG/ACT inhaler Inhale 2 puffs into the lungs 2 (two)  times daily.  . famotidine (PEPCID) 20 MG tablet One after supper  . pantoprazole (PROTONIX) 40 MG tablet Take 1 tablet (40 mg total) by mouth daily. Take 30-60 min before first meal of the day         Objective:    12/10/2018      130  11/05/2018        137   09/24/18 136 lb (61.7 kg)  08/28/18 129 lb  12.8 oz (58.9 kg)    amb elderly bf nad    Vital signs reviewed - Note on arrival 02 sats  97% on RA        HEENT : pt wearing mask not removed for exam due to covid -19 concerns.    NECK :  without JVD/Nodes/TM/ nl carotid upstrokes bilaterally   LUNGS: no acc muscle use,  Nl contour chest which is clear to A and P bilaterally without cough on insp or exp maneuvers   CV:  RRR  no s3 or murmur or increase in P2, and no edema   ABD:  soft and nontender with nl inspiratory excursion in the supine position. No bruits or organomegaly appreciated, bowel sounds nl  MS:  Nl gait/ ext warm without deformities, calf tenderness, cyanosis or clubbing No obvious joint restrictions   SKIN: warm and dry without lesions    NEURO:  alert, approp, nl sensorium with  no motor or cerebellar deficits apparent.                 Assessment

## 2018-12-10 NOTE — Patient Instructions (Addendum)
Plan A = Automatic = Always=    symbicort 160 Take 2 puffs first thing in am and then another 2 puffs about 12 hours later.   Work on inhaler technique:  relax and gently blow all the way out then take a nice smooth deep breath back in, triggering the inhaler at same time you start breathing in.  Hold for up to 5 seconds if you can. Blow out thru nose. Rinse and gargle with water when done      Plan B = Backup (to supplement plan A, not to replace it) Only use your albuterol neb  as a rescue medication to be used if you can't catch your breath by resting or doing a relaxed purse lip breathing pattern.  - The less you use it, the better it will work when you need it. - Ok to use the  Up to  every 4 hours if you must but call for appointment if use goes up over your usual need   If breathing getting worse despite the above or start needing more of your nebulizer  > Prednisone 10 mg take  4 each am x 2 days,   2 each am x 2 days,  1 each am x 2 days and stop   Please schedule a follow up office visit in 4 weeks, sooner if needed  with all medications /inhalers/ solutions in hand so we can verify exactly what you are taking. This includes all medications from all doctors and over the counters  add:  pulmocort 0.5 mg each am per neb added due to poor hfa (blows out, not in)

## 2018-12-11 ENCOUNTER — Telehealth: Payer: Self-pay | Admitting: *Deleted

## 2018-12-11 NOTE — Telephone Encounter (Signed)
LMTCB

## 2018-12-11 NOTE — Telephone Encounter (Signed)
Left message for patient to call back  

## 2018-12-11 NOTE — Telephone Encounter (Signed)
-----   Message from Tanda Rockers, MD sent at 12/10/2018  2:26 PM EDT ----- Arloa Koh get her budesonide 0.5 mg q am per neb - that should be 80% covered by Medicare Part B thru [employer] or [employer]

## 2018-12-11 NOTE — Telephone Encounter (Signed)
Patient is returning phone call. Patient phone number is [phone].

## 2018-12-17 NOTE — Telephone Encounter (Signed)
LMTCB

## 2018-12-18 MED ORDER — BUDESONIDE 0.5 MG/2ML IN SUSP: 0.5000 mg | Freq: Every day | RESPIRATORY_TRACT | 11 refills | Status: DC

## 2018-12-18 NOTE — Telephone Encounter (Signed)
Spoke with the pt and notified of recs per MW  She is not familiar with DME and wants to try getting med from [facility] was sent and pt to call if any problems with obtaining med  Nothing further needed

## 2019-01-06 ENCOUNTER — Ambulatory Visit (INDEPENDENT_AMBULATORY_CARE_PROVIDER_SITE_OTHER): Payer: Medicare Other | Admitting: Internal Medicine

## 2019-01-06 ENCOUNTER — Other Ambulatory Visit: Payer: Self-pay

## 2019-01-06 ENCOUNTER — Encounter: Payer: Self-pay | Admitting: Internal Medicine

## 2019-01-06 DIAGNOSIS — J45991 Cough variant asthma: Secondary | ICD-10-CM | POA: Diagnosis not present

## 2019-01-06 MED ORDER — PREDNISONE 10 MG PO TABS: ORAL_TABLET | ORAL | 11 refills | Status: DC

## 2019-01-06 NOTE — Assessment & Plan Note (Signed)
Onset 2017 in setting of very large [facility] 08/28/2018    try symbicort 160 2bid and 12 d course of pred/ add max gerd rx  - Allergy profile 08/28/2018 >  Eos 0.7 /  IgE  75  RAST neg - 12/10/2018  After extensive coaching inhaler device,  effectiveness =    25%  > continue symb 160 2bid back up with neb saba an prednisone if flares  And added bud 0.5 mg q am    Doing ok so far but very easily confused with maint vs prns, contingency plans  I had an extended discussion with the patient reviewing all relevant studies completed to date and  lasting 15 to 20 minutes of a 25 minute visit    Each maintenance medication was reviewed in detail including most importantly the difference between maintenance and prns and under what circumstances the prns are to be triggered using an action plan format that is not reflected in the computer generated alphabetically organized AVS.     Please see AVS for specific instructions unique to this visit that I personally wrote and verbalized to the the pt in detail and then reviewed with pt  by my nurse highlighting any  changes in therapy recommended at today's visit to their plan of care.

## 2019-01-06 NOTE — Progress Notes (Signed)
Mary Solis, female    DOB: 12/03/1940    MRN: [id number]   Brief patient profile:  46 yobf never smoker with no prior resp problems until admit [facility] 2017 multiple times but continued to have spells coughing and sob > admitted over Mother's day 2020 again [name] > no better since unless actively  on prednisone with worse w/in a week off  so self referred to pulmonary clinic 08/28/2018      History of Present Illness  08/28/2018  Pulmonary/ 1st office eval/Lonya Johannesen  Chief Complaint  Patient presents with  . Pulmonary Consult    Self referral for recurrent bronchitis. Pt c/o non prod cough and occ chest congestion.   Dyspnea:  Slow walk anywhere she wants to go when she's not having a flare  Cough: mostly p lies down min prod mucoid  Sleep: can't sleep even in a chair due to chest tight  SABA use: multiple forms hfa and neb qid  rec Prednisone Take 4 for three days 3 for three days 2 for three days 1 for three days and stop  Plan A = Automatic = symbicort 160 Take 2 puffs first thing in am and then another 2 puffs about 12 hours later.  Work on inhaler technique:    Plan B = Backup Only use your albuterol (Proair)inhaler as a rescue medication  Plan C = Crisis - only use your albuterol nebulizer if you first try Plan B and it fails to help > ok to use the nebulizer up to every 4 hours but if start needing it regularly call for immediate appointment Please remember to go to the lab and x-ray department   for your tests - we will call you with the results when they are available.       09/24/2018  f/u ov/Yahir Tavano re: dtc asthma / steroid responsive/ did not take symbicort correctly  Chief Complaint  Patient presents with  . Follow-up    Breathing is doing well and she has not need her neb.   Dyspnea:  Can  Now walk to son's house up hill s stopping or walk the neighborhood x 10 min  Cough: none  Sleeping: sleeps flat  SABA use: none  02: none  rec If any problem at all with  your breathing or coughing take symbicort 160 up to 2 puffs every 12 hours  Work on inhaler technique:   Only use your albuterol as a rescue Continue Pantoprazole (protonix) 40 mg   Take  30-60 min before first meal of the day and Pepcid (famotidine)  20 mg one after supper   If getting much worse >  Prednisone 10 mg take  4 each am x 2 days,   2 each am x 2 days,  1 each am x 2 days and stop  Please schedule a follow up office visit in 6 weeks, call sooner if needed with all medications /inhalers/ solutions in hand so we can verify exactly what you are taking. This includes all medications from all doctors and over the counters      11/05/2018  f/u ov/Santana Gosdin re: s/p admit [facility] with asthma, not using symb approp  Chief Complaint  Patient presents with  . Follow-up    Pt states had to be admitted to [facility] 10-20-18-10-24-18 for bronchitis. She has been using her neb bid since she was d/c'ed. She states "I feel fine" and denies any cough, wheezing or SOB. She c/o diarrhea and stomach upset that she relates  to recent steroids.   started worse 10/19/2018 and so started back on symb 160 and then prednisone next day and still ended up in hosp Dyspnea:  Back walking to son's house  Cough: none Sleeping:  Fine  SABA use: neb  Bid  02: none rec Plan A = Automatic = Always=    symbicort 160 Take 2 puffs first thing in am and then another 2 puffs about 12 hours later.  Work on inhaler technique:    Plan B = Backup (to supplement plan A, not to replace it) Only use your albuterol neb  as a rescue medication If breathing getting worse despite the above > Prednisone 10 mg take  4 each am x 2 days,   2 each am x 2 days,  1 each am x 2 days and stop  Please schedule a follow up office visit in 4 weeks, sooner if needed  with all medications /inhalers/ solutions in hand so we can verify exactly what you are taking. This includes all medications from all doctors and over the counters    12/10/2018  f/u ov/Terrall Bley  re: dtc asthma on symb 160 2bid/ pred as backup  Chief Complaint  Patient presents with  . Follow-up    Breathing has been doing "wonderful" and she has not been using her neb.    Dyspnea:  Back to  Walking to son's house  Cough: none Sleeping: ok flat SABA use: not lately rec Plan A = Automatic = Always=    symbicort 160 Take 2 puffs first thing in am and then another 2 puffs about 12 hours later.  Budesonide   0.5 mg each am per neb added due to poor hfa (blows out, not in)  Work on inhaler technique:  Plan B = Backup (to supplement plan A, not to replace it) Only use your albuterol neb  as a rescue medication  If breathing getting worse despite the above or start needing more of your nebulizer  > Prednisone 10 mg take  4 each am x 2 days,   2 each am x 2 days,  1 each am x 2 days and stop  Please schedule a follow up office visit in 4 weeks, sooner if needed  with all medications /inhalers/ solutions.   Virtual Visit via Telephone Note 01/06/2019   I connected with Josefa Half on 01/06/19 at  1:15  PM EST by telephone and verified that I am speaking with the correct person using two identifiers.   I discussed the limitations, risks, security and privacy concerns of performing an evaluation and management service by telephone and the availability of in person appointments. I also discussed with the patient that there may be a patient responsible charge related to this service. The patient expressed understanding and agreed to proceed.   History of Present Illness: Very confused with contingency plans  Dyspnea: around the house ok - not walking to son's house  Cough: not much  Sleeping: ok flat  SABA [id number]  Bid, with prompting could recall she could use it up every 4 h max dose   02: none    No obvious day to day or daytime variability or assoc excess/ purulent sputum or mucus plugs or hemoptysis or cp or chest tightness, subjective wheeze or overt sinus or hb symptoms.     Also denies any obvious fluctuation of symptoms with weather or environmental changes or other aggravating or alleviating factors except as outlined above.   Meds reviewed/ med  reconciliation completed       Observations/Objective: Sound fine on phone though easily confused with details of care   Assessment and Plan: See problem list for active a/p's   Follow Up Instructions: See avs for instructions unique to this ov which includes revised/ updated med list     I discussed the assessment and treatment plan with the patient. The patient was provided an opportunity to ask questions and all were answered. The patient agreed with the plan and demonstrated an understanding of the instructions.   The patient was advised to call back or seek an in-person evaluation if the symptoms worsen or if the condition fails to improve as anticipated.  I provided 25 minutes of non-face-to-face time during this encounter.   Sandrea Hughs, MD

## 2019-01-06 NOTE — Patient Instructions (Signed)
Plan A = Automatic = Always=    Budesonide 0.5 mg per Nebulizer in AM only plus symbicort 160 Take 2 puffs first thing in am and then another 2 puffs about 12 hours later.   Work on inhaler technique:  relax and gently blow all the way out then take a nice smooth deep breath back in, triggering the inhaler at same time you start breathing in.  Hold for up to 5 seconds if you can. Blow out thru nose. Rinse and gargle with water when done    Plan B = Backup (to supplement plan A, not to replace it) Only use your albuterol neb  as a rescue medication to be used if you can't catch your breath by resting or doing a relaxed purse lip breathing pattern.  - The less you use it, the better it will work when you need it. - Ok to use the  Up to  every 4 hours if you must but call for appointment if use goes up over your usual need   If breathing getting worse despite the above or start needing more of your nebulizer  > Prednisone 10 mg take  4 each am x 2 days,   2 each am x 2 days,  1 each am x 2 days and stop   Please schedule a follow up office visit in 4 weeks, sooner if needed - televist

## 2019-02-04 ENCOUNTER — Encounter: Payer: Self-pay | Admitting: Internal Medicine

## 2019-02-04 ENCOUNTER — Ambulatory Visit (INDEPENDENT_AMBULATORY_CARE_PROVIDER_SITE_OTHER): Payer: Medicare Other | Admitting: Internal Medicine

## 2019-02-04 ENCOUNTER — Ambulatory Visit (INDEPENDENT_AMBULATORY_CARE_PROVIDER_SITE_OTHER): Payer: Medicare Other

## 2019-02-04 ENCOUNTER — Other Ambulatory Visit: Payer: Self-pay

## 2019-02-04 DIAGNOSIS — R0600 Dyspnea, unspecified: Secondary | ICD-10-CM | POA: Diagnosis not present

## 2019-02-04 DIAGNOSIS — R0609 Other forms of dyspnea: Secondary | ICD-10-CM

## 2019-02-04 DIAGNOSIS — K219 Gastro-esophageal reflux disease without esophagitis: Secondary | ICD-10-CM

## 2019-02-04 DIAGNOSIS — J45991 Cough variant asthma: Secondary | ICD-10-CM

## 2019-02-04 LAB — CBC WITH DIFFERENTIAL/PLATELET
Basophils Absolute: 0.1 10*3/uL (ref 0.0–0.1)
Basophils Relative: 0.7 % (ref 0.0–3.0)
Eosinophils Absolute: 0.4 10*3/uL (ref 0.0–0.7)
Eosinophils Relative: 4.2 % (ref 0.0–5.0)
HCT: 47 % — ABNORMAL HIGH (ref 36.0–46.0)
Hemoglobin: 15.2 g/dL — ABNORMAL HIGH (ref 12.0–15.0)
Lymphocytes Relative: 20 % (ref 12.0–46.0)
Lymphs Abs: 2 10*3/uL (ref 0.7–4.0)
MCHC: 32.4 g/dL (ref 30.0–36.0)
MCV: 87.5 fl (ref 78.0–100.0)
Monocytes Absolute: 0.6 10*3/uL (ref 0.1–1.0)
Monocytes Relative: 6.2 % (ref 3.0–12.0)
Neutro Abs: 6.8 10*3/uL (ref 1.4–7.7)
Neutrophils Relative %: 68.9 % (ref 43.0–77.0)
Platelets: 300 10*3/uL (ref 150.0–400.0)
RBC: 5.38 Mil/uL — ABNORMAL HIGH (ref 3.87–5.11)
RDW: 13.9 % (ref 11.5–15.5)
WBC: 9.9 10*3/uL (ref 4.0–10.5)

## 2019-02-04 LAB — BASIC METABOLIC PANEL
BUN: 21 mg/dL (ref 6–23)
CO2: 29 mEq/L (ref 19–32)
Calcium: 9.4 mg/dL (ref 8.4–10.5)
Chloride: 101 mEq/L (ref 96–112)
Creatinine, Ser: 0.67 mg/dL (ref 0.40–1.20)
GFR: 102.36 mL/min (ref >60.00)
Glucose, Bld: 105 mg/dL — ABNORMAL HIGH (ref 70–99)
Potassium: 3.3 mEq/L — ABNORMAL LOW (ref 3.5–5.1)
Sodium: 140 mEq/L (ref 135–145)

## 2019-02-04 LAB — TSH: TSH: 0.67 u[IU]/mL (ref 0.35–4.50)

## 2019-02-04 LAB — BRAIN NATRIURETIC PEPTIDE: Pro B Natriuretic peptide (BNP): 31 pg/mL (ref 0.0–100.0)

## 2019-02-04 MED ORDER — METHYLPREDNISOLONE ACETATE 80 MG/ML IJ SUSP
120.0000 mg | Freq: Once | INTRAMUSCULAR | Status: AC
  Administered 2019-02-04: 120 mg via INTRAMUSCULAR

## 2019-02-04 NOTE — Progress Notes (Signed)
Mary Solis, female    DOB: 12/03/1940    MRN: [id number]   Brief patient profile:  46 yobf never smoker with no prior resp problems until admit [facility] 2017 multiple times but continued to have spells coughing and sob > admitted over Mother's day 2020 again [name] > no better since unless actively  on prednisone with worse w/in a week off  so self referred to pulmonary clinic 08/28/2018      History of Present Illness  08/28/2018  Pulmonary/ 1st office eval/Mary Solis  Chief Complaint  Patient presents with  . Pulmonary Consult    Self referral for recurrent bronchitis. Pt c/o non prod cough and occ chest congestion.   Dyspnea:  Slow walk anywhere she wants to go when she's not having a flare  Cough: mostly p lies down min prod mucoid  Sleep: can't sleep even in a chair due to chest tight  SABA use: multiple forms hfa and neb qid  rec Prednisone Take 4 for three days 3 for three days 2 for three days 1 for three days and stop  Plan A = Automatic = symbicort 160 Take 2 puffs first thing in am and then another 2 puffs about 12 hours later.  Work on inhaler technique:    Plan B = Backup Only use your albuterol (Proair)inhaler as a rescue medication  Plan C = Crisis - only use your albuterol nebulizer if you first try Plan B and it fails to help > ok to use the nebulizer up to every 4 hours but if start needing it regularly call for immediate appointment Please remember to go to the lab and x-ray department   for your tests - we will call you with the results when they are available.       09/24/2018  f/u ov/Mary Solis re: dtc asthma / steroid responsive/ did not take symbicort correctly  Chief Complaint  Patient presents with  . Follow-up    Breathing is doing well and she has not need her neb.   Dyspnea:  Can  Now walk to son's house up hill s stopping or walk the neighborhood x 10 min  Cough: none  Sleeping: sleeps flat  SABA use: none  02: none  rec If any problem at all with  your breathing or coughing take symbicort 160 up to 2 puffs every 12 hours  Work on inhaler technique:   Only use your albuterol as a rescue Continue Pantoprazole (protonix) 40 mg   Take  30-60 min before first meal of the day and Pepcid (famotidine)  20 mg one after supper   If getting much worse >  Prednisone 10 mg take  4 each am x 2 days,   2 each am x 2 days,  1 each am x 2 days and stop  Please schedule a follow up office visit in 6 weeks, call sooner if needed with all medications /inhalers/ solutions in hand so we can verify exactly what you are taking. This includes all medications from all doctors and over the counters      11/05/2018  f/u ov/Mary Solis re: s/p admit [facility] with asthma, not using symb approp  Chief Complaint  Patient presents with  . Follow-up    Pt states had to be admitted to [facility] 10-20-18-10-24-18 for bronchitis. She has been using her neb bid since she was d/c'ed. She states "I feel fine" and denies any cough, wheezing or SOB. She c/o diarrhea and stomach upset that she relates  to recent steroids.   started worse 10/19/2018 and so started back on symb 160 and then prednisone next day and still ended up in hosp Dyspnea:  Back walking to son's house  Cough: none Sleeping:  Fine  SABA use: neb  Bid  02: none rec Plan A = Automatic = Always=    symbicort 160 Take 2 puffs first thing in am and then another 2 puffs about 12 hours later.  Work on inhaler technique:    Plan B = Backup (to supplement plan A, not to replace it) Only use your albuterol neb  as a rescue medication If breathing getting worse despite the above > Prednisone 10 mg take  4 each am x 2 days,   2 each am x 2 days,  1 each am x 2 days and stop  Please schedule a follow up office visit in 4 weeks, sooner if needed  with all medications /inhalers/ solutions in hand so we can verify exactly what you are taking. This includes all medications from all doctors and over the counters    12/10/2018  f/u ov/Mary Solis  re: dtc asthma on symb 160 2bid/ pred as backup  Chief Complaint  Patient presents with  . Follow-up    Breathing has been doing "wonderful" and she has not been using her neb.   Dyspnea:  Back to  Walking to son's house  Cough: none Sleeping: ok flat SABA use: not lately 02: none  rec  Plan A = Automatic = Always=    symbicort 160 Take 2 puffs first thing in am and then another 2 puffs about 12 hours later.  Work on inhaler technique: Plan B = Backup (to supplement plan A, not to replace it) Only use your albuterol neb  as a rescue medication If breathing getting worse despite the above or start needing more of your nebulizer  > Prednisone 10 mg take  4 each am x 2 days,   2 each am x 2 days,  1 each am x 2 days and stop  Please schedule a follow up office visit in 4 weeks, sooner if needed  with all medications /inhalers/ solutions in hand so we can verify exactly what you are taking. This includes all medications from all doctors and over the counters  add:  pulmocort 0.5 mg each am per neb added due to poor hfa (blows out, not in)      televisit  01/06/2019 Very confused with contingency plans  Dyspnea: around the house ok - not walking to son's house any more Cough: not much  Sleeping: ok flat  SABA [id number]  Bid, with prompting could recall she could use it up every 4 h max dose   02: none  rec No change    02/04/2019  Acute extended  ov/Mary Solis re: dtc asthma/ not following instructions/ did not bring meds  Gradually worse since failed to refill meds not sure which ones but def no longer on budesonide Chief Complaint  Patient presents with  . Acute Visit    SOB, drained   Dyspnea:  Room to room is ok/ showering is difficult/ houswork is difficult Cough: none  Sleeping: 2 pillows flat bed, wakes up sob and once or twice a week needs alb saba noct  SABA use: not using daytime/ ran out of bud sev weeks prior to OV  And did not know it was supposed to be refilled  02:  none   No obvious day to day or  daytime variability or assoc excess/ purulent sputum or mucus plugs or hemoptysis or cp or chest tightness, subjective wheeze or overt sinus or hb symptoms.     Also denies any obvious fluctuation of symptoms with weather or environmental changes or other aggravating or alleviating factors except as outlined above   No unusual exposure hx or h/o childhood pna/ asthma or knowledge of premature birth.  Current Allergies, Complete Past Medical History, Past Surgical History, Family History, and Social History were reviewed in [facility] electronic medical record.  ROS  The following are not active complaints unless bolded Hoarseness, sore throat, dysphagia, dental problems, itching, sneezing,  nasal congestion or discharge of excess mucus or purulent secretions, ear ache,   fever, chills, sweats, unintended wt loss or wt gain, classically pleuritic or exertional cp,  orthopnea pnd or arm/hand swelling  or leg swelling, presyncope, palpitations, abdominal pain, anorexia, nausea, vomiting, diarrhea  or change in bowel habits or change in bladder habits, change in stools or change in urine, dysuria, hematuria,  rash, arthralgias, visual complaints, headache, numbness, weakness or ataxia or problems with walking or coordination,  change in mood or  memory.        Current Meds  Medication Sig  . albuterol (PROVENTIL) (2.5 MG/3ML) 0.083% nebulizer solution Use up  to every 4 hours if needed  . budesonide (PULMICORT) 0.5 MG/2ML nebulizer solution Take 2 mLs (0.5 mg total) by nebulization daily.  . budesonide-formoterol (SYMBICORT) 160-4.5 MCG/ACT inhaler Inhale 2 puffs into the lungs 2 (two) times daily.  . famotidine (PEPCID) 20 MG tablet One after supper  . pantoprazole (PROTONIX) 40 MG tablet Take 1 tablet (40 mg total) by mouth daily. Take 30-60 min before first meal of the day  . predniSONE (DELTASONE) 10 MG tablet Take  4 each am x 2 days,   2 each am x 2 days,   1 each am x 2 days and stop           Objective:    02/04/2019      117  12/10/2018      130  11/05/2018        137   09/24/18 136 lb (61.7 kg)  08/28/18 129 lb 12.8 oz (58.9 kg)      Vital signs reviewed - Note on arrival 02 sats  91% on RA     HEENT : pt wearing mask not removed for exam due to covid -19 concerns.    NECK :  without JVD/Nodes/TM/ nl carotid upstrokes bilaterally   LUNGS: no acc muscle use,  Nl contour chest with insp/exp rhonchi bilaterally without cough on insp or exp maneuvers   CV:  RRR  no s3 or murmur or increase in P2, and no edema   ABD:  soft and nontender with nl inspiratory excursion in the supine position. No bruits or organomegaly appreciated, bowel sounds nl  MS:  Nl gait/ ext warm without deformities, calf tenderness, cyanosis or clubbing No obvious joint restrictions   SKIN: warm and dry without lesions    NEURO:  alert, approp, nl sensorium with  no motor or cerebellar deficits apparent.       CXR PA and Lateral:   02/04/2019 :    I personally reviewed images and agree with radiology impression as follows:   Bronchitic changes. Large hiatal hernia.   Labs ordered/ reviewed:      Chemistry      Component Value Date/Time   NA 140 02/04/2019 1116  K 3.3 (L) 02/04/2019 1116   CL 101 02/04/2019 1116   CO2 29 02/04/2019 1116   BUN 21 02/04/2019 1116   CREATININE 0.67 02/04/2019 1116      Component Value Date/Time   CALCIUM 9.4 02/04/2019 1116        Lab Results  Component Value Date   WBC 9.9 02/04/2019   HGB 15.2 (H) 02/04/2019   HCT 47.0 (H) 02/04/2019   MCV 87.5 02/04/2019   PLT 300.0 02/04/2019       EOS                                                               0.4                                    02/04/2019                   Lab Results  Component Value Date   TSH 0.67 02/04/2019     Lab Results  Component Value Date   PROBNP 31.0 02/04/2019                 Assessment

## 2019-02-04 NOTE — Patient Instructions (Addendum)
Depomedrol 120 mg IM today   Plan A = Automatic = Always=    Budesonide 0.5 mg per Nebulizer in AM only plus symbicort 160 Take 2 puffs first thing in am and then another 2 puffs about 12 hours later.   Work on inhaler technique:  relax and gently blow all the way out then take a nice smooth deep breath back in, triggering the inhaler at same time you start breathing in.  Hold for up to 5 seconds if you can. Blow out thru nose. Rinse and gargle with water when done   Plan B = Backup (to supplement plan A, not to replace it) Only use your albuterol neb  as a rescue medication to be used if you can't catch your breath by resting or doing a relaxed purse lip breathing pattern.  - The less you use it, the better it will work when you need it. - Ok to use the  Up to  every 4 hours if you must but call for appointment if use goes up over your usual need   If breathing getting worse despite the above or start needing more of your nebulizer  > Prednisone 10 mg take  4 each am x 2 days,   2 each am x 2 days,  1 each am x 2 days and stop   Please remember to go to the lab and x-ray department   for your tests - we will call you with the results when they are available.     Please schedule a follow up visit in 6  months but call sooner if needed

## 2019-02-05 NOTE — Progress Notes (Signed)
Spoke with pt and notified of results per Dr. Wert. Pt verbalized understanding and denied any questions. 

## 2019-02-06 ENCOUNTER — Encounter: Payer: Self-pay | Admitting: Internal Medicine

## 2019-02-06 DIAGNOSIS — K219 Gastro-esophageal reflux disease without esophagitis: Secondary | ICD-10-CM | POA: Insufficient documentation

## 2019-02-06 NOTE — Assessment & Plan Note (Signed)
No evidence for anemia, thyroid dz, chf, or metabolic problem causeing doe so focus on airways for now  (see separate a/p)

## 2019-02-06 NOTE — Assessment & Plan Note (Signed)
Large HH on cxr 02/04/2019   >>> reviewed diet/ rx timing for ppi ac and h2 q pm    I had an extended discussion with the patient reviewing all relevant studies completed to date and  lasting 25 minutes of a 40  minute acute office visit addressing chronic/ relapsing  non-specific but potentially very serious  respiratory symptoms of uncertain and potentially multiple  etiologies.  I performed device teaching  using a teach back technique which also  extended face to face time for this visit (see above)   Each maintenance medication was reviewed in detail including most importantly the difference between maintenance and prns and under what circumstances the prns are to be triggered using an action plan format that is not reflected in the computer generated alphabetically organized AVS.    Please see AVS for specific instructions unique to this office visit that I personally wrote and verbalized to the the pt in detail and then reviewed with pt  by my nurse highlighting any changes in therapy/plan of care  recommended at today's visit.

## 2019-02-06 NOTE — Assessment & Plan Note (Addendum)
Onset 2017 in setting of very large [facility] 08/28/2018    try symbicort 160 2bid and 12 d course of pred/ add max gerd rx  - Allergy profile 08/28/2018 >  Eos 0.7 /  IgE  75  RAST neg - 12/10/2018  After extensive coaching inhaler device,  effectiveness =    25%  > continue symb 160 2bid back up with neb saba an prednisone if flares  And added bud 0.5 mg q am - 01/06/2019 prednisone x 6 days added to back up plans > not using pred or bud at ov 02/04/2019 with worse sob/ rhonchi on exam   Does report always responds to prednisone po but does not understand the nature of maint vs prns and how/ when to refill her meds and gradually worse p pred and no longer on bud  - The proper method of use, as well as anticipated side effects, of a metered-dose inhaler are discussed and demonstrated to the patient. Improved effectiveness after extensive coaching during this visit to a level of approximately 0 % from a baseline of 0 % so doubt symbicort is helping her but doubt she'll be able to use DPI as blows on her inhaler when she should be breathing in.   Neb is only solution for now but can't afford laba on this plan since it's an HMO and not commercial plan.  rec Restart pulmocort q am and continue to refill it as she has a year's supply Plan B = saba neb up to q4 h prn  Prednisone as plan C  - The goal with a chronic steroid dependent illness is always arriving at the lowest effective dose that controls the disease/symptoms and not accepting a set "formula" which is based on statistics or guidelines that don't always take into account patient  variability or the natural hx of the dz in every individual patient, which may well vary over time.  For now therefore I recommend the patient just use a 6 day course if breaks thru budesonide   Pt informed of the seriousness of COVID 19 infection as a direct risk to lung health  and safey and to close contacts and should continue to wear a facemask in public and minimize  exposure to public locations but especially avoid any area or activity where non-close contacts are not observing distancing or wearing an appropriate face mask.  I strongly recommended vaccine when offered.    >>>  She was given very specific written plan to help reduce unnecessary outpt or ER visits/ exposure to COVID 19 over the next 6 m

## 2019-03-06 ENCOUNTER — Ambulatory Visit: Payer: Medicare Other | Admitting: Internal Medicine

## 2019-04-21 ENCOUNTER — Telehealth: Payer: Self-pay | Admitting: Internal Medicine

## 2019-04-21 NOTE — Telephone Encounter (Signed)
Attempted to contact pt, I did not receive an answer and I could not leave a message. Will try back.

## 2019-04-23 NOTE — Telephone Encounter (Signed)
Called and spoke to pt. Pt states she has had a recent increase in SOB. Pt denies cough, chest congestion, chest pain or tightness, f/c/s. Pt states she just feels a little tired with SOB. Pt states she started taking a pred taper that MW has given her (a refill) and states she will call back if the pred does not help. Pt started taking pred today.   Will forward to MW as Lorain Childes.

## 2019-04-28 DIAGNOSIS — Z789 Other specified health status: Secondary | ICD-10-CM | POA: Diagnosis not present

## 2019-04-28 DIAGNOSIS — Z299 Encounter for prophylactic measures, unspecified: Secondary | ICD-10-CM | POA: Diagnosis not present

## 2019-04-28 DIAGNOSIS — J45909 Unspecified asthma, uncomplicated: Secondary | ICD-10-CM | POA: Diagnosis not present

## 2019-04-28 DIAGNOSIS — M549 Dorsalgia, unspecified: Secondary | ICD-10-CM | POA: Diagnosis not present

## 2019-04-28 DIAGNOSIS — I10 Essential (primary) hypertension: Secondary | ICD-10-CM | POA: Diagnosis not present

## 2019-05-02 DIAGNOSIS — R112 Nausea with vomiting, unspecified: Secondary | ICD-10-CM | POA: Diagnosis not present

## 2019-05-02 DIAGNOSIS — R509 Fever, unspecified: Secondary | ICD-10-CM | POA: Diagnosis not present

## 2019-05-02 DIAGNOSIS — A419 Sepsis, unspecified organism: Secondary | ICD-10-CM | POA: Diagnosis not present

## 2019-05-03 DIAGNOSIS — Z7951 Long term (current) use of inhaled steroids: Secondary | ICD-10-CM | POA: Diagnosis not present

## 2019-05-03 DIAGNOSIS — R112 Nausea with vomiting, unspecified: Secondary | ICD-10-CM | POA: Diagnosis not present

## 2019-05-03 DIAGNOSIS — R109 Unspecified abdominal pain: Secondary | ICD-10-CM | POA: Diagnosis not present

## 2019-05-03 DIAGNOSIS — Z20822 Contact with and (suspected) exposure to covid-19: Secondary | ICD-10-CM | POA: Diagnosis not present

## 2019-05-03 DIAGNOSIS — J40 Bronchitis, not specified as acute or chronic: Secondary | ICD-10-CM | POA: Diagnosis not present

## 2019-05-03 DIAGNOSIS — A419 Sepsis, unspecified organism: Secondary | ICD-10-CM | POA: Diagnosis not present

## 2019-05-03 DIAGNOSIS — J449 Chronic obstructive pulmonary disease, unspecified: Secondary | ICD-10-CM | POA: Diagnosis not present

## 2019-05-03 DIAGNOSIS — J9809 Other diseases of bronchus, not elsewhere classified: Secondary | ICD-10-CM | POA: Diagnosis not present

## 2019-05-03 DIAGNOSIS — R509 Fever, unspecified: Secondary | ICD-10-CM | POA: Diagnosis not present

## 2019-05-03 DIAGNOSIS — N209 Urinary calculus, unspecified: Secondary | ICD-10-CM | POA: Diagnosis not present

## 2019-05-03 DIAGNOSIS — R0902 Hypoxemia: Secondary | ICD-10-CM | POA: Diagnosis not present

## 2019-05-03 DIAGNOSIS — Z7982 Long term (current) use of aspirin: Secondary | ICD-10-CM | POA: Diagnosis not present

## 2019-05-03 DIAGNOSIS — E873 Alkalosis: Secondary | ICD-10-CM | POA: Diagnosis not present

## 2019-05-03 DIAGNOSIS — R05 Cough: Secondary | ICD-10-CM | POA: Diagnosis not present

## 2019-05-03 DIAGNOSIS — N200 Calculus of kidney: Secondary | ICD-10-CM | POA: Diagnosis not present

## 2019-05-11 DIAGNOSIS — Z299 Encounter for prophylactic measures, unspecified: Secondary | ICD-10-CM | POA: Diagnosis not present

## 2019-05-11 DIAGNOSIS — J45909 Unspecified asthma, uncomplicated: Secondary | ICD-10-CM | POA: Diagnosis not present

## 2019-05-11 DIAGNOSIS — N200 Calculus of kidney: Secondary | ICD-10-CM | POA: Diagnosis not present

## 2019-05-11 DIAGNOSIS — I10 Essential (primary) hypertension: Secondary | ICD-10-CM | POA: Diagnosis not present

## 2019-05-13 DIAGNOSIS — I10 Essential (primary) hypertension: Secondary | ICD-10-CM | POA: Diagnosis not present

## 2019-05-13 DIAGNOSIS — Z0000 Encounter for general adult medical examination without abnormal findings: Secondary | ICD-10-CM | POA: Diagnosis not present

## 2019-05-13 DIAGNOSIS — Z299 Encounter for prophylactic measures, unspecified: Secondary | ICD-10-CM | POA: Diagnosis not present

## 2019-05-13 DIAGNOSIS — Z1211 Encounter for screening for malignant neoplasm of colon: Secondary | ICD-10-CM | POA: Diagnosis not present

## 2019-05-13 DIAGNOSIS — Z7189 Other specified counseling: Secondary | ICD-10-CM | POA: Diagnosis not present

## 2019-05-20 ENCOUNTER — Telehealth: Payer: Self-pay | Admitting: Internal Medicine

## 2019-05-20 NOTE — Telephone Encounter (Signed)
Needs to read her avs again but especially the last line:  If breathing getting worse despite the above or start needing more of your nebulizer (albterol)  > Prednisone 10 mg take  4 each am x 2 days,   2 each am x 2 days,  1 each am x 2 days and stop   (should have refillable rx)

## 2019-05-20 NOTE — Telephone Encounter (Signed)
Called and spoke to pt. Informed her of the recs per MW. Pt verbalized understanding and denied any further questions or concerns at this time.   

## 2019-05-20 NOTE — Telephone Encounter (Signed)
Spoke with patient. She stated that she has noticed an increase in SOB for the past week, especially on exertion. She also has a non-productive cough and low energy levels. She denies having a fever, body aches or chills. Also denies being around anyone with covid recently. She has been using her Symbicort and budesonide nebs as prescribed with no relief.   Pharmacy is [facility] in [place].   MW, please advise. Thanks!

## 2019-05-20 NOTE — Telephone Encounter (Signed)
Called and spoke to pt. Advised pt that she could take it now if she wanted to but to be sure she takes it around the same time each day and to take with food. Pt states she feels she can wait till tomorrow so she will start the taper tomorrow morning. Nothing further needed at this time. Will sign off.

## 2019-05-21 DIAGNOSIS — J45909 Unspecified asthma, uncomplicated: Secondary | ICD-10-CM | POA: Diagnosis not present

## 2019-05-21 DIAGNOSIS — R0602 Shortness of breath: Secondary | ICD-10-CM | POA: Diagnosis not present

## 2019-05-21 DIAGNOSIS — R0902 Hypoxemia: Secondary | ICD-10-CM | POA: Diagnosis not present

## 2019-05-21 DIAGNOSIS — Z20822 Contact with and (suspected) exposure to covid-19: Secondary | ICD-10-CM | POA: Diagnosis not present

## 2019-05-21 DIAGNOSIS — K219 Gastro-esophageal reflux disease without esophagitis: Secondary | ICD-10-CM | POA: Diagnosis not present

## 2019-05-21 DIAGNOSIS — Z7951 Long term (current) use of inhaled steroids: Secondary | ICD-10-CM | POA: Diagnosis not present

## 2019-05-21 DIAGNOSIS — Z7982 Long term (current) use of aspirin: Secondary | ICD-10-CM | POA: Diagnosis not present

## 2019-05-22 DIAGNOSIS — J45909 Unspecified asthma, uncomplicated: Secondary | ICD-10-CM | POA: Diagnosis not present

## 2019-05-22 DIAGNOSIS — Z7982 Long term (current) use of aspirin: Secondary | ICD-10-CM | POA: Diagnosis not present

## 2019-05-22 DIAGNOSIS — Z7951 Long term (current) use of inhaled steroids: Secondary | ICD-10-CM | POA: Diagnosis not present

## 2019-05-22 DIAGNOSIS — K219 Gastro-esophageal reflux disease without esophagitis: Secondary | ICD-10-CM | POA: Diagnosis not present

## 2019-05-23 DIAGNOSIS — J45909 Unspecified asthma, uncomplicated: Secondary | ICD-10-CM | POA: Diagnosis not present

## 2019-05-23 DIAGNOSIS — Z7951 Long term (current) use of inhaled steroids: Secondary | ICD-10-CM | POA: Diagnosis not present

## 2019-05-23 DIAGNOSIS — K219 Gastro-esophageal reflux disease without esophagitis: Secondary | ICD-10-CM | POA: Diagnosis not present

## 2019-05-23 DIAGNOSIS — Z7982 Long term (current) use of aspirin: Secondary | ICD-10-CM | POA: Diagnosis not present

## 2019-06-01 DIAGNOSIS — Z299 Encounter for prophylactic measures, unspecified: Secondary | ICD-10-CM | POA: Diagnosis not present

## 2019-06-01 DIAGNOSIS — J45909 Unspecified asthma, uncomplicated: Secondary | ICD-10-CM | POA: Diagnosis not present

## 2019-06-01 DIAGNOSIS — I10 Essential (primary) hypertension: Secondary | ICD-10-CM | POA: Diagnosis not present

## 2019-06-05 DIAGNOSIS — E2839 Other primary ovarian failure: Secondary | ICD-10-CM | POA: Diagnosis not present

## 2019-06-05 DIAGNOSIS — J449 Chronic obstructive pulmonary disease, unspecified: Secondary | ICD-10-CM | POA: Diagnosis not present

## 2019-07-05 DIAGNOSIS — J449 Chronic obstructive pulmonary disease, unspecified: Secondary | ICD-10-CM | POA: Diagnosis not present

## 2019-07-21 DIAGNOSIS — K219 Gastro-esophageal reflux disease without esophagitis: Secondary | ICD-10-CM | POA: Diagnosis not present

## 2019-07-21 DIAGNOSIS — R918 Other nonspecific abnormal finding of lung field: Secondary | ICD-10-CM | POA: Diagnosis not present

## 2019-07-21 DIAGNOSIS — J189 Pneumonia, unspecified organism: Secondary | ICD-10-CM | POA: Diagnosis not present

## 2019-07-21 DIAGNOSIS — R0902 Hypoxemia: Secondary | ICD-10-CM | POA: Diagnosis not present

## 2019-07-21 DIAGNOSIS — K449 Diaphragmatic hernia without obstruction or gangrene: Secondary | ICD-10-CM | POA: Diagnosis not present

## 2019-07-21 DIAGNOSIS — R0602 Shortness of breath: Secondary | ICD-10-CM | POA: Diagnosis not present

## 2019-07-21 DIAGNOSIS — J45909 Unspecified asthma, uncomplicated: Secondary | ICD-10-CM | POA: Diagnosis not present

## 2019-07-21 DIAGNOSIS — B379 Candidiasis, unspecified: Secondary | ICD-10-CM | POA: Diagnosis not present

## 2019-07-21 DIAGNOSIS — I700 Atherosclerosis of aorta: Secondary | ICD-10-CM | POA: Diagnosis not present

## 2019-07-21 DIAGNOSIS — J40 Bronchitis, not specified as acute or chronic: Secondary | ICD-10-CM | POA: Diagnosis not present

## 2019-08-03 DIAGNOSIS — M549 Dorsalgia, unspecified: Secondary | ICD-10-CM | POA: Diagnosis not present

## 2019-08-03 DIAGNOSIS — I10 Essential (primary) hypertension: Secondary | ICD-10-CM | POA: Diagnosis not present

## 2019-08-03 DIAGNOSIS — Z299 Encounter for prophylactic measures, unspecified: Secondary | ICD-10-CM | POA: Diagnosis not present

## 2019-08-03 DIAGNOSIS — J45909 Unspecified asthma, uncomplicated: Secondary | ICD-10-CM | POA: Diagnosis not present

## 2019-08-04 ENCOUNTER — Ambulatory Visit (INDEPENDENT_AMBULATORY_CARE_PROVIDER_SITE_OTHER): Payer: Medicare Other | Admitting: Internal Medicine

## 2019-08-04 ENCOUNTER — Encounter: Payer: Self-pay | Admitting: Internal Medicine

## 2019-08-04 ENCOUNTER — Other Ambulatory Visit: Payer: Self-pay

## 2019-08-04 DIAGNOSIS — J45991 Cough variant asthma: Secondary | ICD-10-CM | POA: Diagnosis not present

## 2019-08-04 NOTE — Assessment & Plan Note (Signed)
Onset 2017 in setting of very large [facility] 08/28/2018    try symbicort 160 2bid and 12 d course of pred/ add max gerd rx  - Allergy profile 08/28/2018 >  Eos 0.7 /  IgE  75  RAST neg - 12/10/2018  After extensive coaching inhaler device,  effectiveness =    25%  > continue symb 160 2bid back up with neb saba an prednisone if flares  And added bud 0.5 mg q am - 01/06/2019 prednisone x 6 days added to back up plans > not using pred or bud at ov 02/04/2019 with worse sob/ rhonchi on exam   Despite continued confusion with maint vs prns names of meds "it's what you Dr Manuella Ghazi and your put me on" > All goals of chronic asthma control met including optimal function and elimination of symptoms with minimal need for rescue therapy.  Contingencies discussed in full including contacting this office immediately if not controlling the symptoms using the rule of two's.     Pt informed of the seriousness of COVID 19 infection as a direct risk to lung health  and safey and to close contacts and should continue to wear a facemask in public and minimize exposure to public locations but especially avoid any area or activity where non-close contacts are not observing distancing or wearing an appropriate face mask.  I strongly recommended vaccine but she flatly refused to hear risk/benefit  "I'd rather you just let me die" under the scenario where she gets covid and facing eol issues.   rec  She discuss this with fm esp if the delta variant and f/u with me in the [place] office with all meds in hand using a trust but verify approach to confirm accurate Medication  Reconciliation The principal here is that until we are certain that the  patients are doing what we've asked, it makes no sense to ask them to do more.          Each maintenance medication was reviewed in detail including emphasizing most importantly the difference between maintenance and prns and under what circumstances the prns are to be triggered using an  action plan format where appropriate.  Total time for H and P, chart review, counseling, teaching device and generating customized AVS unique to this office visit / charting = 20 min

## 2019-08-04 NOTE — Progress Notes (Signed)
Mary Solis, female    DOB: 12/03/1940    MRN: [id number]   Brief patient profile:  46 yobf never smoker with no prior resp problems until admit [facility] 2017 multiple times but continued to have spells coughing and sob > admitted over Mother's day 2020 again [name] > no better since unless actively  on prednisone with worse w/in a week off  so self referred to pulmonary clinic 08/28/2018      History of Present Illness  08/28/2018  Pulmonary/ 1st office eval/Mary Solis  Chief Complaint  Patient presents with  . Pulmonary Consult    Self referral for recurrent bronchitis. Pt c/o non prod cough and occ chest congestion.   Dyspnea:  Slow walk anywhere she wants to go when she's not having a flare  Cough: mostly p lies down min prod mucoid  Sleep: can't sleep even in a chair due to chest tight  SABA use: multiple forms hfa and neb qid  rec Prednisone Take 4 for three days 3 for three days 2 for three days 1 for three days and stop  Plan A = Automatic = symbicort 160 Take 2 puffs first thing in am and then another 2 puffs about 12 hours later.  Work on inhaler technique:    Plan B = Backup Only use your albuterol (Proair)inhaler as a rescue medication  Plan C = Crisis - only use your albuterol nebulizer if you first try Plan B and it fails to help > ok to use the nebulizer up to every 4 hours but if start needing it regularly call for immediate appointment Please remember to go to the lab and x-ray department   for your tests - we will call you with the results when they are available.       09/24/2018  f/u ov/Mary Solis re: dtc asthma / steroid responsive/ did not take symbicort correctly  Chief Complaint  Patient presents with  . Follow-up    Breathing is doing well and she has not need her neb.   Dyspnea:  Can  Now walk to son's house up hill s stopping or walk the neighborhood x 10 min  Cough: none  Sleeping: sleeps flat  SABA use: none  02: none  rec If any problem at all with  your breathing or coughing take symbicort 160 up to 2 puffs every 12 hours  Work on inhaler technique:   Only use your albuterol as a rescue Continue Pantoprazole (protonix) 40 mg   Take  30-60 min before first meal of the day and Pepcid (famotidine)  20 mg one after supper   If getting much worse >  Prednisone 10 mg take  4 each am x 2 days,   2 each am x 2 days,  1 each am x 2 days and stop  Please schedule a follow up office visit in 6 weeks, call sooner if needed with all medications /inhalers/ solutions in hand so we can verify exactly what you are taking. This includes all medications from all doctors and over the counters      11/05/2018  f/u ov/Mary Solis re: s/p admit [facility] with asthma, not using symb approp  Chief Complaint  Patient presents with  . Follow-up    Pt states had to be admitted to [facility] 10-20-18-10-24-18 for bronchitis. She has been using her neb bid since she was d/c'ed. She states "I feel fine" and denies any cough, wheezing or SOB. She c/o diarrhea and stomach upset that she relates  to recent steroids.   started worse 10/19/2018 and so started back on symb 160 and then prednisone next day and still ended up in hosp Dyspnea:  Back walking to son's house  Cough: none Sleeping:  Fine  SABA use: neb  Bid  02: none rec Plan A = Automatic = Always=    symbicort 160 Take 2 puffs first thing in am and then another 2 puffs about 12 hours later.  Work on inhaler technique:    Plan B = Backup (to supplement plan A, not to replace it) Only use your albuterol neb  as a rescue medication If breathing getting worse despite the above > Prednisone 10 mg take  4 each am x 2 days,   2 each am x 2 days,  1 each am x 2 days and stop  Please schedule a follow up office visit in 4 weeks, sooner if needed  with all medications /inhalers/ solutions in hand so we can verify exactly what you are taking. This includes all medications from all doctors and over the counters    12/10/2018  f/u ov/Mary Solis  re: dtc asthma on symb 160 2bid/ pred as backup  Chief Complaint  Patient presents with  . Follow-up    Breathing has been doing "wonderful" and she has not been using her neb.   Dyspnea:  Back to  Walking to son's house  Cough: none Sleeping: ok flat SABA use: not lately 02: none  rec  Plan A = Automatic = Always=    symbicort 160 Take 2 puffs first thing in am and then another 2 puffs about 12 hours later.  Work on inhaler technique: Plan B = Backup (to supplement plan A, not to replace it) Only use your albuterol neb  as a rescue medication If breathing getting worse despite the above or start needing more of your nebulizer  > Prednisone 10 mg take  4 each am x 2 days,   2 each am x 2 days,  1 each am x 2 days and stop  Please schedule a follow up office visit in 4 weeks, sooner if needed  with all medications /inhalers/ solutions in hand so we can verify exactly what you are taking. This includes all medications from all doctors and over the counters  add:  pulmocort 0.5 mg each am per neb added due to poor hfa (blows out, not in)      televisit  01/06/2019 Very confused with contingency plans  Dyspnea: around the house ok - not walking to son's house any more Cough: not much  Sleeping: ok flat  SABA BPZ:WCHENID  Bid, with prompting could recall she could use it up every 4 h max dose   02: none  rec No change    02/04/2019  Acute extended  ov/Mary Solis re: dtc asthma/ not following instructions/ did not bring meds  Gradually worse since failed to refill meds not sure which ones but def no longer on budesonide Chief Complaint  Patient presents with  . Acute Visit    SOB, drained   Dyspnea:  Room to room is ok/ showering is difficult/ houswork is difficult Cough: none  Sleeping: 2 pillows flat bed, wakes up sob and once or twice a week needs alb saba noct  SABA use: not using daytime/ ran out of bud sev weeks prior to OV  And did not know it was supposed to be refilled  02:  none  rec Depomedrol 120 mg IM today  Plan A = Automatic = Always=    Budesonide 0.5 mg per Nebulizer in AM only plus symbicort 160 Take 2 puffs first thing in am and then another 2 puffs about 12 hours later.  Work on inhaler technique:  Plan B = Backup (to supplement plan A, not to replace it) Only use your albuterol neb  as a rescue medication  If breathing getting worse despite the above or start needing more of your nebulizer  > Prednisone 10 mg take  4 each am x 2 days,   2 each am x 2 days,  1 each am x 2 days and stop    Admit [facility] with "pna"  Cleared per pt by d/c /  finished abx prednisone week of 08/03/19  >>> req d/c summary    08/04/2019  f/u ov/Adetokunbo Mccadden re: asthma/ non-adherence/ declines covid "I never get the flu" Chief Complaint  Patient presents with  . Follow-up    Pt is doing well. Slight congestion at times.   Dyspnea:  Walks up to son's house uphill s compliants Cough: no Sleeping: flat bed / 1 pillow  SABA use: using albuterol  02: none    No obvious day to day or daytime variability or assoc excess/ purulent sputum or mucus plugs or hemoptysis or cp or chest tightness, subjective wheeze or overt sinus or hb symptoms.   Sleeping as above  without nocturnal  or early am exacerbation  of respiratory  c/o's or need for noct saba. Also denies any obvious fluctuation of symptoms with weather or environmental changes or other aggravating or alleviating factors except as outlined above   No unusual exposure hx or h/o childhood pna/ asthma or knowledge of premature birth.  Current Allergies, Complete Past Medical History, Past Surgical History, Family History, and Social History were reviewed in [employer] record.  ROS  The following are not active complaints unless bolded Hoarseness, sore throat, dysphagia, dental problems, itching, sneezing,  nasal congestion or discharge of excess mucus or purulent secretions, ear ache,   fever, chills,  sweats, unintended wt loss or wt gain, classically pleuritic or exertional cp,  orthopnea pnd or arm/hand swelling  or leg swelling, presyncope, palpitations, abdominal pain, anorexia, nausea, vomiting, diarrhea  or change in bowel habits or change in bladder habits, change in stools or change in urine, dysuria, hematuria,  rash, arthralgias, visual complaints, headache, numbness, weakness or ataxia or problems with walking or coordination,  change in mood or  memory.        Current Meds-   - NOTE:   Unable to verify as accurately reflecting what pt takes     Medication Sig  . albuterol (PROVENTIL) (2.5 MG/3ML) 0.083% nebulizer solution Use up  to every 4 hours if needed  . budesonide (PULMICORT) 0.5 MG/2ML nebulizer solution Take 2 mLs (0.5 mg total) by nebulization daily.  . budesonide-formoterol (SYMBICORT) 160-4.5 MCG/ACT inhaler Inhale 2 puffs into the lungs 2 (two) times daily.  . famotidine (PEPCID) 20 MG tablet One after supper  . pantoprazole (PROTONIX) 40 MG tablet Take 1 tablet (40 mg total) by mouth daily. Take 30-60 min before first meal of the day  .                       Objective:    08/04/2019        131  12/10/2018      130  11/05/2018        137  09/24/18 136 lb (61.7 kg)  08/28/18 129 lb 12.8 oz (58.9 kg)      Vital signs reviewed  08/04/2019  - Note at rest 02 sats  94% on RA    HEENT : pt wearing mask not removed for exam due to covid -19 concerns.    NECK :  without JVD/Nodes/TM/ nl carotid upstrokes bilaterally   LUNGS: no acc muscle use,  Nl contour chest which is clear to A and P bilaterally without cough on insp or exp maneuvers   CV:  RRR  no s3 or murmur or increase in P2, and no edema   ABD:  soft and nontender with nl inspiratory excursion in the supine position. No bruits or organomegaly appreciated, bowel sounds nl  MS:  Nl gait/ ext warm without deformities, calf tenderness, cyanosis or clubbing No obvious joint restrictions   SKIN:  warm and dry without lesions    NEURO:  alert, approp, nl sensorium with  no motor or cerebellar deficits apparent.                             Assessment

## 2019-08-04 NOTE — Patient Instructions (Signed)
I will let Dr Sherryll Burger know that you do not wish to be put on life support under any circumstances but you need to discuss this with your daughter especially if you chose not to be vaccinated for the Covid 19 which is the best way to prevent the infection ( if the delta variant shows up in [place] please reconsider your decision) .  We can see you in [place] in one month but you must bring all your medications if your want our help in 2 separate  =>  Automatic daily vs bag #2 is up you.

## 2019-08-05 ENCOUNTER — Ambulatory Visit: Payer: Medicare Other | Admitting: Internal Medicine

## 2019-08-05 DIAGNOSIS — J449 Chronic obstructive pulmonary disease, unspecified: Secondary | ICD-10-CM | POA: Diagnosis not present

## 2019-08-19 DIAGNOSIS — R0902 Hypoxemia: Secondary | ICD-10-CM | POA: Diagnosis not present

## 2019-08-19 DIAGNOSIS — Z7951 Long term (current) use of inhaled steroids: Secondary | ICD-10-CM | POA: Diagnosis not present

## 2019-08-19 DIAGNOSIS — J4541 Moderate persistent asthma with (acute) exacerbation: Secondary | ICD-10-CM | POA: Diagnosis not present

## 2019-08-19 DIAGNOSIS — K449 Diaphragmatic hernia without obstruction or gangrene: Secondary | ICD-10-CM | POA: Diagnosis not present

## 2019-08-19 DIAGNOSIS — R062 Wheezing: Secondary | ICD-10-CM | POA: Diagnosis not present

## 2019-08-19 DIAGNOSIS — R0602 Shortness of breath: Secondary | ICD-10-CM | POA: Diagnosis not present

## 2019-08-19 DIAGNOSIS — Z20822 Contact with and (suspected) exposure to covid-19: Secondary | ICD-10-CM | POA: Diagnosis not present

## 2019-08-19 DIAGNOSIS — Z743 Need for continuous supervision: Secondary | ICD-10-CM | POA: Diagnosis not present

## 2019-08-19 DIAGNOSIS — I10 Essential (primary) hypertension: Secondary | ICD-10-CM | POA: Diagnosis not present

## 2019-08-19 DIAGNOSIS — J189 Pneumonia, unspecified organism: Secondary | ICD-10-CM | POA: Diagnosis not present

## 2019-08-26 DIAGNOSIS — J45909 Unspecified asthma, uncomplicated: Secondary | ICD-10-CM | POA: Diagnosis not present

## 2019-08-31 DIAGNOSIS — I10 Essential (primary) hypertension: Secondary | ICD-10-CM | POA: Diagnosis not present

## 2019-08-31 DIAGNOSIS — J45909 Unspecified asthma, uncomplicated: Secondary | ICD-10-CM | POA: Diagnosis not present

## 2019-08-31 DIAGNOSIS — Z299 Encounter for prophylactic measures, unspecified: Secondary | ICD-10-CM | POA: Diagnosis not present

## 2019-08-31 DIAGNOSIS — J9611 Chronic respiratory failure with hypoxia: Secondary | ICD-10-CM | POA: Diagnosis not present

## 2019-09-04 DIAGNOSIS — J449 Chronic obstructive pulmonary disease, unspecified: Secondary | ICD-10-CM | POA: Diagnosis not present

## 2019-09-09 ENCOUNTER — Encounter: Payer: Self-pay | Admitting: Internal Medicine

## 2019-09-09 ENCOUNTER — Ambulatory Visit (HOSPITAL_COMMUNITY)
Admission: RE | Admit: 2019-09-09 | Discharge: 2019-09-09 | Disposition: A | Discharge status: Home / Self Care | Payer: Medicare Other | Source: Ambulatory Visit | Attending: Internal Medicine | Admitting: Internal Medicine

## 2019-09-09 ENCOUNTER — Other Ambulatory Visit: Payer: Self-pay

## 2019-09-09 ENCOUNTER — Ambulatory Visit (INDEPENDENT_AMBULATORY_CARE_PROVIDER_SITE_OTHER): Payer: Medicare Other | Admitting: Internal Medicine

## 2019-09-09 DIAGNOSIS — J45991 Cough variant asthma: Secondary | ICD-10-CM | POA: Insufficient documentation

## 2019-09-09 DIAGNOSIS — J45909 Unspecified asthma, uncomplicated: Secondary | ICD-10-CM | POA: Diagnosis not present

## 2019-09-09 DIAGNOSIS — K449 Diaphragmatic hernia without obstruction or gangrene: Secondary | ICD-10-CM | POA: Diagnosis not present

## 2019-09-09 NOTE — Patient Instructions (Addendum)
Make sure you check your oxygen saturations at highest level of activity to be sure it stays over 90% and keep track of it at least once a week, more often if breathing getting worse, and let me know if losing ground.   Plan A = Automatic = Always=    BREO  100 one click each am   Work on inhaler technique:  relax and gently blow all the way out then take a nice smooth deep breath back in, triggering the inhaler at same time you start breathing in.  Hold for up to 5 seconds if you can. Blow out thru nose. Rinse and gargle with water when done   Plan B = Backup (to supplement plan A, not to replace it) Only use your albuterol-ipatrorium neb  as a rescue medication to be used if you can't catch your breath by resting or doing a relaxed purse lip breathing pattern.  - The less you use it, the better it will work when you need it. - Ok to use the  Up to  every 4 hours if you must but call for appointment if use goes up over your usual need   Plan C  If breathing getting worse despite the above or start needing more of your nebulizer  > Prednisone 10 mg take  4 each am x 2 days,   2 each am x 2 days,  1 each am x 2 days and stop   Please remember to go to the x-ray department   for your tests - we will call you with the results when they are available.    Pt informed of the seriousness of COVID 19 infection as a direct risk to lung health  and safey and to close contacts and should continue to wear a facemask in public and minimize exposure to public locations but especially avoid any area or activity where non-close contacts are not observing distancing or wearing an appropriate face mask.  I strongly recommended she take either of the vaccines available through local drugstores based on updated information on millions of Americans treated with the Moderna and [employer] products  which have proven both safe and  effective even against the new delta variant.    Please schedule a follow up office visit in  6 weeks, call sooner if needed  - please bring all your medication

## 2019-09-09 NOTE — Progress Notes (Addendum)
Mary Solis, female    DOB: 1940-05-31    MRN: [id number]   Brief patient profile:  [id number] never smoker with no prior resp problems until admit [facility] hospital 2017 multiple times but continued to have spells coughing and sob > admitted over Mother's day 2020 again [name] > no better since unless actively  on prednisone with worse w/in a week off  so self referred to pulmonary clinic 08/28/2018      History of Present Illness  08/28/2018  Pulmonary/ 1st office eval/Mary Solis  Chief Complaint  Patient presents with  . Pulmonary Consult    Self referral for recurrent bronchitis. Pt c/o non prod cough and occ chest congestion.   Dyspnea:  Slow walk anywhere she wants to go when she's not having a flare  Cough: mostly p lies down min prod mucoid  Sleep: can't sleep even in a chair due to chest tight  SABA use: multiple forms hfa and neb qid  rec Prednisone Take 4 for three days 3 for three days 2 for three days 1 for three days and stop  Plan A = Automatic = symbicort 160 Take 2 puffs first thing in am and then another 2 puffs about 12 hours later.  Work on inhaler technique:    Plan B = Backup Only use your albuterol (Proair)inhaler as a rescue medication  Plan C = Crisis - only use your albuterol nebulizer if you first try Plan B and it fails to help > ok to use the nebulizer up to every 4 hours but if start needing it regularly call for immediate appointment         09/24/2018  f/u ov/Mary Solis re: dtc asthma / steroid responsive/ did not take symbicort correctly  Chief Complaint  Patient presents with  . Follow-up    Breathing is doing well and she has not need her neb.   Dyspnea:  Can  Now walk to son's house up hill s stopping or walk the neighborhood x 10 min  Cough: none  Sleeping: sleeps flat  SABA use: none  02: none  rec If any problem at all with your breathing or coughing take symbicort 160 up to 2 puffs every 12 hours  Work on inhaler technique:   Only use your albuterol  as a rescue Continue Pantoprazole (protonix) 40 mg   Take  30-60 min before first meal of the day and Pepcid (famotidine)  20 mg one after supper   If getting much worse >  Prednisone 10 mg take  4 each am x 2 days,   2 each am x 2 days,  1 each am x 2 days and stop  Please schedule a follow up office visit in 6 weeks, call sooner if needed with all medications /inhalers/ solutions in hand so we can verify exactly what you are taking. This includes all medications from all doctors and over the counters      11/05/2018  f/u ov/Mary Solis re: s/p admit [facility] with asthma, not using symb approp  Chief Complaint  Patient presents with  . Follow-up    Pt states had to be admitted to [facility] 10-20-18-10-24-18 for bronchitis. She has been using her neb bid since she was d/c'ed. She states "I feel fine" and denies any cough, wheezing or SOB. She c/o diarrhea and stomach upset that she relates to recent steroids.   started worse 10/19/2018 and so started back on symb 160 and then prednisone next day and still ended up in hosp  Dyspnea:  Back walking to son's house  Cough: none Sleeping:  Fine  SABA use: neb  Bid  02: none rec Plan A = Automatic = Always=    symbicort 160 Take 2 puffs first thing in am and then another 2 puffs about 12 hours later.  Work on inhaler technique:    Plan B = Backup (to supplement plan A, not to replace it) Only use your albuterol neb  as a rescue medication If breathing getting worse despite the above > Prednisone 10 mg take  4 each am x 2 days,   2 each am x 2 days,  1 each am x 2 days and stop  Please schedule a follow up office visit in 4 weeks, sooner if needed  with all medications /inhalers/ solutions in hand so we can verify exactly what you are taking. This includes all medications from all doctors and over the counters    12/10/2018  f/u ov/Mary Solis re: dtc asthma on symb 160 2bid/ pred as backup  Chief Complaint  Patient presents with  . Follow-up    Breathing has been  doing "wonderful" and she has not been using her neb.   Dyspnea:  Back to  Walking to son's house  Cough: none Sleeping: ok flat SABA use: not lately 02: none  rec  Plan A = Automatic = Always=    symbicort 160 Take 2 puffs first thing in am and then another 2 puffs about 12 hours later.  Work on inhaler technique: Plan B = Backup (to supplement plan A, not to replace it) Only use your albuterol neb  as a rescue medication If breathing getting worse despite the above or start needing more of your nebulizer  > Prednisone 10 mg take  4 each am x 2 days,   2 each am x 2 days,  1 each am x 2 days and stop  Please schedule a follow up office visit in 4 weeks, sooner if needed  with all medications /inhalers/ solutions in hand so we can verify exactly what you are taking. This includes all medications from all doctors and over the counters  add:  pulmocort 0.5 mg each am per neb added due to poor hfa (blows out, not in)      02/04/2019  Acute extended  ov/Mary Solis re: dtc asthma/ not following instructions/ did not bring meds  Gradually worse since failed to refill meds not sure which ones but def no longer on budesonide Chief Complaint  Patient presents with  . Acute Visit    SOB, drained   Dyspnea:  Room to room is ok/ showering is difficult/ houswork is difficult Cough: none  Sleeping: 2 pillows flat bed, wakes up sob and once or twice a week needs alb saba noct  SABA use: not using daytime/ ran out of bud sev weeks prior to OV  And did not know it was supposed to be refilled  02: none  rec Depomedrol 120 mg IM today  Plan A = Automatic = Always=    Budesonide 0.5 mg per Nebulizer in AM only plus symbicort 160 Take 2 puffs first thing in am and then another 2 puffs about 12 hours later.  Work on inhaler technique:  Plan B = Backup (to supplement plan A, not to replace it) Only use your albuterol neb  as a rescue medication  If breathing getting worse despite the above or start needing  more of your nebulizer  > Prednisone 10 mg take  4 each am x 2 days,   2 each am x 2 days,  1 each am x 2 days and stop    Admit [facility] with "pna"  Cleared per pt by d/c /  finished abx prednisone week of 08/03/19  >>> req d/c summary  Admit: 6/8-14/21  Dx multifocal pna and d/c on duoneb qid   08/04/2019  f/u ov/Mary Solis re: asthma/ non-adherence/ declines covid "I never get the flu" Chief Complaint  Patient presents with  . Follow-up    Pt is doing well. Slight congestion at times.   Dyspnea:  Walks up to son's house up[place] s compliants Cough: no Sleeping: flat bed / 1 pillow  SABA use: using albuterol  02: none  rec I will let Dr Sherryll Burger know that you do not wish to be put on life support under any circumstances but you need to discuss this with your daughter especially if you chose not to be vaccinated for the Covid 19 which is the best way to prevent the infection ( if the delta variant shows up in [place] please reconsider your decision) .  We can see you in [place] in one month but you must bring all your medications if your want our help in 2 separate  =>  Automatic daily vs bag #2 is up you.    09/09/2019  f/u ov/Mary Solis re: cough/sob ? All asthma  / rx breo 100 / still no covid 19 vaccination  Chief Complaint  Patient presents with  . Follow-up    No complaints  Dyspnea:  Walking daily x "2 minutes"  With  some inclineds eg walks to son's house daily and "to the stop sign and back" Cough: none  Sleeping: flat/ one pillow  SABA use: neb misunderstood and overusing  02: 2lpm hs    No obvious day to day or daytime variability or assoc excess/ purulent sputum or mucus plugs or hemoptysis or cp or chest tightness, subjective wheeze or overt sinus or hb symptoms.   Sleeping  without nocturnal  or early am exacerbation  of respiratory  c/o's or need for noct saba. Also denies any obvious fluctuation of symptoms with weather or environmental changes or other aggravating or alleviating  factors except as outlined above   No unusual exposure hx or h/o childhood pna/ asthma or knowledge of premature birth.  Current Allergies, Complete Past Medical History, Past Surgical History, Family History, and Social History were reviewed in [employer] record.  ROS  The following are not active complaints unless bolded Hoarseness, sore throat, dysphagia, dental problems, itching, sneezing,  nasal congestion or discharge of excess mucus or purulent secretions, ear ache,   fever, chills, sweats, unintended wt loss or wt gain, classically pleuritic or exertional cp,  orthopnea pnd or arm/hand swelling  or leg swelling, presyncope, palpitations, abdominal pain, anorexia, nausea, vomiting, diarrhea  or change in bowel habits or change in bladder habits, change in stools or change in urine, dysuria, hematuria,  rash, arthralgias, visual complaints, headache, numbness, weakness or ataxia or problems with walking or coordination,  change in mood or  memory.        Current Meds  Medication Sig  . budesonide (PULMICORT) 0.5 MG/2ML nebulizer solution Take 2 mLs (0.5 mg total) by nebulization daily.  . famotidine (PEPCID) 20 MG tablet One after supper  . fluticasone furoate-vilanterol (BREO ELLIPTA) 100-25 MCG/INH AEPB Inhale 1 puff into the lungs daily.  Marland Kitchen ipratropium-albuterol (DUONEB) 0.5-2.5 (3) MG/3ML SOLN Take 3  mLs by nebulization every 4 (four) hours as needed.  . pantoprazole (PROTONIX) 40 MG tablet Take 1 tablet (40 mg total) by mouth daily. Take 30-60 min before first meal of the day                         Objective:    09/09/2019        129 08/04/2019        131  12/10/2018      130  11/05/2018        137   09/24/18 136 lb (61.7 kg)  08/28/18 129 lb 12.8 oz (58.9 kg)     amb bf, still some residual rattling on voluntary cough maneuver   Vital signs reviewed  09/09/2019  - Note at rest 02 sats  97% on RA   HEENT : pt wearing mask not removed for  exam due to covid -19 concerns.    NECK :  without JVD/Nodes/TM/ nl carotid upstrokes bilaterally   LUNGS: no acc muscle use,  Nl contour chest which is clear to A and P bilaterally without cough on insp or exp maneuvers   CV:  RRR  no s3 or murmur or increase in P2, and no edema   ABD:  soft and nontender with nl inspiratory excursion in the supine position. No bruits or organomegaly appreciated, bowel sounds nl  MS:  Nl gait/ ext warm without deformities, calf tenderness, cyanosis or clubbing No obvious joint restrictions   SKIN: warm and dry without lesions    NEURO:  alert, approp, nl sensorium with  no motor or cerebellar deficits apparent.             CXR PA and Lateral:   09/09/2019 :    I personally reviewed images and agree with radiology impression as follows:      1. Interval resolution of previously seen airspace opacities. No acute cardiopulmonary findings. 2. Moderate to large hiatal hernia.              Assessment

## 2019-09-10 ENCOUNTER — Encounter: Payer: Self-pay | Admitting: Internal Medicine

## 2019-09-10 NOTE — Assessment & Plan Note (Addendum)
Onset 2017 in setting of very large [facility] 08/28/2018    try symbicort 160 2bid and 12 d course of pred/ add max gerd rx  - Allergy profile 08/28/2018 >  Eos 0.7 /  IgE  75  RAST neg - 12/10/2018  After extensive coaching inhaler device,  effectiveness =    25%  > continue symb 160 2bid back up with neb saba an prednisone if flares  And added bud 0.5 mg q am - 01/06/2019 prednisone x 6 days added to back up plans > not using pred or bud at f/u ov - 09/09/2019  Added again rec for pred as plan C under ABC action plan and cont neb as plan B with Breo 100 as plan A and if starts needing more plan C then rec increase breo to 200 strength    Asthma exac continue to be difficult to contorl   DDX of  difficult airways management almost all start with A and  include Adherence, Ace Inhibitors, Acid Reflux, Active Sinus Disease, Alpha 1 Antitripsin deficiency, Anxiety masquerading as Airways dz,  ABPA,  Allergy(esp in young), Aspiration (esp in elderly), Adverse effects of meds,  Active smoking or vaping, A bunch of PE's (a small clot burden can't cause this syndrome unless there is already severe underlying pulm or vascular dz with poor reserve) plus two Bs  = Bronchiectasis and Beta blocker use..and one C= CHF  Adherence is always the initial "prime suspect" and is a multilayered concern that requires a "trust but verify" approach in every patient - starting with knowing how to use medications, especially inhalers, correctly, keeping up with refills and understanding the fundamental difference between maintenance and prns vs those medications only taken for a very short course and then stopped and not refilled.  - elipta device reviewed today - return with all meds in hand using a trust but verify approach to confirm accurate Medication  Reconciliation The principal here is that until we are certain that the  patients are doing what we've asked, it makes no sense to ask them to do more.   ? Acid (or non-acid) GERD >  always difficult to exclude as up to 75% of pts in some series report no assoc GI/ Heartburn symptoms> rec continue max (24h)  acid suppression and diet restrictions/ reviewed     ? Allergy / note eos consider biologics if fails to control with breo to 200   ? chf > no evidence of cardiac asthma     Pt informed of the seriousness of COVID 19 infection as a direct risk to lung health  and safey and to close contacts and should continue to wear a facemask in public and minimize exposure to public locations but especially avoid any area or activity where non-close contacts are not observing distancing or wearing an appropriate face mask.  I strongly recommended she take either of the vaccines available through local drugstores based on updated information on millions of Americans treated with the Moderna and [employer] products  which have proven both safe and  effective even against the new delta variant.       Each maintenance medication was reviewed in detail including emphasizing most importantly the difference between maintenance and prns and under what circumstances the prns are to be triggered using an action plan format where appropriate.  Total time for H and P, chart review, counseling, teaching device and generating customized AVS unique to this office visit / charting = 39 min

## 2019-09-10 NOTE — Progress Notes (Signed)
Spoke with pt and notified of results per Dr. Wert. Pt verbalized understanding and denied any questions. 

## 2019-09-26 DIAGNOSIS — J45909 Unspecified asthma, uncomplicated: Secondary | ICD-10-CM | POA: Diagnosis not present

## 2019-10-05 DIAGNOSIS — J449 Chronic obstructive pulmonary disease, unspecified: Secondary | ICD-10-CM | POA: Diagnosis not present

## 2019-10-12 DIAGNOSIS — Z299 Encounter for prophylactic measures, unspecified: Secondary | ICD-10-CM | POA: Diagnosis not present

## 2019-10-12 DIAGNOSIS — I10 Essential (primary) hypertension: Secondary | ICD-10-CM | POA: Diagnosis not present

## 2019-10-12 DIAGNOSIS — J9611 Chronic respiratory failure with hypoxia: Secondary | ICD-10-CM | POA: Diagnosis not present

## 2019-10-21 ENCOUNTER — Ambulatory Visit (INDEPENDENT_AMBULATORY_CARE_PROVIDER_SITE_OTHER): Payer: Medicare HMO | Admitting: Internal Medicine

## 2019-10-21 ENCOUNTER — Encounter: Payer: Self-pay | Admitting: Internal Medicine

## 2019-10-21 ENCOUNTER — Other Ambulatory Visit: Payer: Self-pay

## 2019-10-21 DIAGNOSIS — J45991 Cough variant asthma: Secondary | ICD-10-CM | POA: Diagnosis not present

## 2019-10-21 NOTE — Patient Instructions (Signed)
I very strongly recommend you get the moderna or pfizer vaccine as soon as possible based on your risk of dying from the virus  and the proven safety and benefit of these vaccines against even the delta variant.  This can save your life as well as  those of your loved ones,  especially if they are also not vaccinated.     No change in medications.    Please schedule a follow up visit in 6 months but call sooner if needed.

## 2019-10-21 NOTE — Progress Notes (Signed)
Mary Solis, female    DOB: 09-25-1940    MRN: [id number]   Brief patient profile:  [id number] never smoker with no prior resp problems until admit [facility] hospital 2017 multiple times but continued to have spells coughing and sob > admitted over Mother's day 2020 again [name] > no better since unless actively  on prednisone with worse w/in a week off  so self referred to pulmonary clinic 08/28/2018      History of Present Illness  08/28/2018  Pulmonary/ 1st office eval/Krysten Veronica  Chief Complaint  Patient presents with  . Pulmonary Consult    Self referral for recurrent bronchitis. Pt c/o non prod cough and occ chest congestion.   Dyspnea:  Slow walk anywhere she wants to go when she's not having a flare  Cough: mostly p lies down min prod mucoid  Sleep: can't sleep even in a chair due to chest tight  SABA use: multiple forms hfa and neb qid  rec Prednisone Take 4 for three days 3 for three days 2 for three days 1 for three days and stop  Plan A = Automatic = symbicort 160 Take 2 puffs first thing in am and then another 2 puffs about 12 hours later.  Work on inhaler technique:    Plan B = Backup Only use your albuterol (Proair)inhaler as a rescue medication  Plan C = Crisis - only use your albuterol nebulizer if you first try Plan B and it fails to help > ok to use the nebulizer up to every 4 hours but if start needing it regularly call for immediate appointment         09/24/2018  f/u ov/Anielle Headrick re: dtc asthma / steroid responsive/ did not take symbicort correctly  Chief Complaint  Patient presents with  . Follow-up    Breathing is doing well and she has not need her neb.   Dyspnea:  Can  Now walk to son's house up hill s stopping or walk the neighborhood x 10 min  Cough: none  Sleeping: sleeps flat  SABA use: none  02: none  rec If any problem at all with your breathing or coughing take symbicort 160 up to 2 puffs every 12 hours  Work on inhaler technique:   Only use your albuterol  as a rescue Continue Pantoprazole (protonix) 40 mg   Take  30-60 min before first meal of the day and Pepcid (famotidine)  20 mg one after supper   If getting much worse >  Prednisone 10 mg take  4 each am x 2 days,   2 each am x 2 days,  1 each am x 2 days and stop  Please schedule a follow up office visit in 6 weeks, call sooner if needed with all medications /inhalers/ solutions in hand so we can verify exactly what you are taking. This includes all medications from all doctors and over the counters      11/05/2018  f/u ov/Jawan Chavarria re: s/p admit [facility] with asthma, not using symb approp  Chief Complaint  Patient presents with  . Follow-up    Pt states had to be admitted to [facility] 10-20-18-10-24-18 for bronchitis. She has been using her neb bid since she was d/c'ed. She states "I feel fine" and denies any cough, wheezing or SOB. She c/o diarrhea and stomach upset that she relates to recent steroids.   started worse 10/19/2018 and so started back on symb 160 and then prednisone next day and still ended up in hosp  Dyspnea:  Back walking to son's house  Cough: none Sleeping:  Fine  SABA use: neb  Bid  02: none rec Plan A = Automatic = Always=    symbicort 160 Take 2 puffs first thing in am and then another 2 puffs about 12 hours later.  Work on inhaler technique:    Plan B = Backup (to supplement plan A, not to replace it) Only use your albuterol neb  as a rescue medication If breathing getting worse despite the above > Prednisone 10 mg take  4 each am x 2 days,   2 each am x 2 days,  1 each am x 2 days and stop  Please schedule a follow up office visit in 4 weeks, sooner if needed  with all medications /inhalers/ solutions in hand so we can verify exactly what you are taking. This includes all medications from all doctors and over the counters    12/10/2018  f/u ov/Kiana Hollar re: dtc asthma on symb 160 2bid/ pred as backup  Chief Complaint  Patient presents with  . Follow-up    Breathing has been  doing "wonderful" and she has not been using her neb.   Dyspnea:  Back to  Walking to son's house  Cough: none Sleeping: ok flat SABA use: not lately 02: none  rec  Plan A = Automatic = Always=    symbicort 160 Take 2 puffs first thing in am and then another 2 puffs about 12 hours later.  Work on inhaler technique: Plan B = Backup (to supplement plan A, not to replace it) Only use your albuterol neb  as a rescue medication If breathing getting worse despite the above or start needing more of your nebulizer  > Prednisone 10 mg take  4 each am x 2 days,   2 each am x 2 days,  1 each am x 2 days and stop  Please schedule a follow up office visit in 4 weeks, sooner if needed  with all medications /inhalers/ solutions in hand so we can verify exactly what you are taking. This includes all medications from all doctors and over the counters  add:  pulmocort 0.5 mg each am per neb added due to poor hfa (blows out, not in)      02/04/2019  Acute extended  ov/Tachina Spoonemore re: dtc asthma/ not following instructions/ did not bring meds  Gradually worse since failed to refill meds not sure which ones but def no longer on budesonide Chief Complaint  Patient presents with  . Acute Visit    SOB, drained   Dyspnea:  Room to room is ok/ showering is difficult/ houswork is difficult Cough: none  Sleeping: 2 pillows flat bed, wakes up sob and once or twice a week needs alb saba noct  SABA use: not using daytime/ ran out of bud sev weeks prior to OV  And did not know it was supposed to be refilled  02: none  rec Depomedrol 120 mg IM today  Plan A = Automatic = Always=    Budesonide 0.5 mg per Nebulizer in AM only plus symbicort 160 Take 2 puffs first thing in am and then another 2 puffs about 12 hours later.  Work on inhaler technique:  Plan B = Backup (to supplement plan A, not to replace it) Only use your albuterol neb  as a rescue medication  If breathing getting worse despite the above or start needing  more of your nebulizer  > Prednisone 10 mg take  4 each am x 2 days,   2 each am x 2 days,  1 each am x 2 days and stop    Admit [facility] with "pna"  Cleared per pt by d/c /  finished abx prednisone week of 08/03/19  >>> req d/c summary  Admit: 6/8-14/21  Dx multifocal pna and d/c on duoneb qid   08/04/2019  f/u ov/Adhya Cocco re: asthma/ non-adherence/ declines covid "I never get the flu" Chief Complaint  Patient presents with  . Follow-up    Pt is doing well. Slight congestion at times.   Dyspnea:  Walks up to son's house uphill s compliants Cough: no Sleeping: flat bed / 1 pillow  SABA use: using albuterol  02: none  rec I will let Dr Sherryll Burger know that you do not wish to be put on life support under any circumstances but you need to discuss this with your daughter especially if you chose not to be vaccinated for the Covid 19 which is the best way to prevent the infection ( if the delta variant shows up in [place] please reconsider your decision) .  We can see you in [place] in one month but you must bring all your medications if your want our help in 2 separate  =>  Automatic daily vs bag #2 is up you.    09/09/2019  f/u ov/Zariyah Stephens re: cough/sob ? All asthma  / rx breo 100 / still no covid 19 vaccination  Chief Complaint  Patient presents with  . Follow-up    No complaints  Dyspnea:  Walking daily x "2 minutes"  With  some inclines eg walks to son's house daily and "to the stop sign and back" Cough: none  Sleeping: flat/ one pillow  SABA use: neb misunderstood and overusing  02: 2lpm hs  rec  Make sure you check your oxygen saturations at highest level of activity to be sure it stays over 90% and keep track of it at least once a week, more often if breathing getting worse, and let me know if losing ground.  Plan A = Automatic = Always=    BREO  100 one click each am  Work on inhaler technique:   Plan B = Backup (to supplement plan A, not to replace it) Only use your albuterol-ipatrorium neb   as a rescue medication Plan C  If breathing getting worse despite the above or start needing more of your nebulizer  > Prednisone 10 mg take  4 each am x 2 days,   2 each am x 2 days,  1 each am x 2 days and stop  Pt informed of the seriousness of COVID 19 infection    10/21/2019  f/u ov/[place] office/Alease Fait re: chronic asthma maint on Breo 100 Chief Complaint  Patient presents with  . Follow-up    sinus congestion for past couple days  Dyspnea:  Still able to do walks to stop sign and son's house  Cough: some assoc with pnds  Sleeping: flat bed one pillow  SABA use: none lately nor needing  pred as plan C 02: none    No obvious day to day or daytime variability or assoc excess/ purulent sputum or mucus plugs or hemoptysis or cp or chest tightness, subjective wheeze or overt sinus or hb symptoms.   Sleeping  without nocturnal  or early am exacerbation  of respiratory  c/o's or need for noct saba. Also denies any obvious fluctuation of symptoms with weather or environmental changes or other  aggravating or alleviating factors except as outlined above   No unusual exposure hx or h/o childhood pna/ asthma or knowledge of premature birth.  Current Allergies, Complete Past Medical History, Past Surgical History, Family History, and Social History were reviewed in [employer] record.  ROS  The following are not active complaints unless bolded Hoarseness, sore throat, dysphagia, dental problems, itching, sneezing,  nasal congestion or discharge of excess mucus or purulent secretions, ear ache,   fever, chills, sweats, unintended wt loss or wt gain, classically pleuritic or exertional cp,  orthopnea pnd or arm/hand swelling  or leg swelling, presyncope, palpitations, abdominal pain, anorexia, nausea, vomiting, diarrhea  or change in bowel habits or change in bladder habits, change in stools or change in urine, dysuria, hematuria,  rash, arthralgias, visual complaints,  headache, numbness, weakness or ataxia or problems with walking or coordination,  change in mood or  memory.        Current Meds  Medication Sig  . albuterol (PROVENTIL) (2.5 MG/3ML) 0.083% nebulizer solution Use up  to every 4 hours if needed  . famotidine (PEPCID) 20 MG tablet One after supper  . fluticasone furoate-vilanterol (BREO ELLIPTA) 100-25 MCG/INH AEPB Inhale 1 puff into the lungs daily.  Marland Kitchen ipratropium-albuterol (DUONEB) 0.5-2.5 (3) MG/3ML SOLN Take 3 mLs by nebulization every 4 (four) hours as needed.  . pantoprazole (PROTONIX) 40 MG tablet Take 1 tablet (40 mg total) by mouth daily. Take 30-60 min before first meal of the day                      Objective:     10/21/2019          134  09/09/2019        129 08/04/2019        131  12/10/2018      130  11/05/2018        137   09/24/18 136 lb (61.7 kg)  08/28/18 129 lb 12.8 oz (58.9 kg)    Vital signs reviewed  10/21/2019  - Note at rest 02 sats  97% on RA   HEENT : pt wearing mask not removed for exam due to covid -19 concerns.    NECK :  without JVD/Nodes/TM/ nl carotid upstrokes bilaterally   LUNGS: no acc muscle use,  Nl contour chest which is clear to A and P bilaterally without cough on insp or exp maneuvers   CV:  RRR  no s3 or murmur or increase in P2, and no edema   ABD:  soft and nontender with nl inspiratory excursion in the supine position. No bruits or organomegaly appreciated, bowel sounds nl  MS:  Nl gait/ ext warm without deformities, calf tenderness, cyanosis or clubbing No obvious joint restrictions   SKIN: warm and dry without lesions    NEURO:  alert, approp, nl sensorium with  no motor or cerebellar deficits apparent.               Assessment

## 2019-10-22 ENCOUNTER — Encounter: Payer: Self-pay | Admitting: Internal Medicine

## 2019-10-22 NOTE — Assessment & Plan Note (Addendum)
Onset 2017 in setting of very large [facility] 08/28/2018    try symbicort 160 2bid and 12 d course of pred/ add max gerd rx  - Allergy profile 08/28/2018 >  Eos 0.7 /  IgE  75  RAST neg - 12/10/2018  After extensive coaching inhaler device,  effectiveness =    25%  > continue symb 160 2bid back up with neb saba an prednisone if flares  And added bud 0.5 mg q am - 01/06/2019 prednisone x 6 days added to back up plans > not using pred or bud at ov - 09/09/2019  Added again rec for pred as plan C under ABC action plan and cont neb as plan B with Breo 100 as plan A and if starts needing more plan C then rec increase breo to 200 strength  All goals of chronic asthma control met including optimal function and elimination of symptoms with minimal need for rescue therapy.  Contingencies discussed in full including contacting this office immediately if not controlling the symptoms using the rule of two's.     >>> no change BREO 100 q d  Pt informed of the seriousness of COVID 19 infection as a direct risk to lung health  and safey and to close contacts and should continue to wear a facemask in public and minimize exposure to public locations but especially avoid any area or activity where non-close contacts are not observing distancing or wearing an appropriate face mask.  I strongly recommended she take either of the vaccines available through local drugstores based on updated information on millions of Americans treated with the [place] products  which have proven both safe and  effective even against the new delta variant.           Each maintenance medication was reviewed in detail including emphasizing most importantly the difference between maintenance and prns and under what circumstances the prns are to be triggered using an action plan format where appropriate.  Total time for H and P, chart review, counseling, teaching device and generating customized AVS unique to this office visit / charting  = 20 min

## 2019-11-05 DIAGNOSIS — J449 Chronic obstructive pulmonary disease, unspecified: Secondary | ICD-10-CM | POA: Diagnosis not present

## 2020-03-10 DIAGNOSIS — Z299 Encounter for prophylactic measures, unspecified: Secondary | ICD-10-CM | POA: Diagnosis not present

## 2020-03-10 DIAGNOSIS — I10 Essential (primary) hypertension: Secondary | ICD-10-CM | POA: Diagnosis not present

## 2020-03-10 DIAGNOSIS — Z789 Other specified health status: Secondary | ICD-10-CM | POA: Diagnosis not present

## 2020-03-10 DIAGNOSIS — J45909 Unspecified asthma, uncomplicated: Secondary | ICD-10-CM | POA: Diagnosis not present

## 2020-03-10 DIAGNOSIS — D692 Other nonthrombocytopenic purpura: Secondary | ICD-10-CM | POA: Diagnosis not present

## 2020-03-14 DIAGNOSIS — K219 Gastro-esophageal reflux disease without esophagitis: Secondary | ICD-10-CM | POA: Diagnosis not present

## 2020-03-14 DIAGNOSIS — I10 Essential (primary) hypertension: Secondary | ICD-10-CM | POA: Diagnosis not present

## 2020-05-11 DIAGNOSIS — I509 Heart failure, unspecified: Secondary | ICD-10-CM | POA: Diagnosis not present

## 2020-05-11 DIAGNOSIS — K219 Gastro-esophageal reflux disease without esophagitis: Secondary | ICD-10-CM | POA: Diagnosis not present

## 2020-05-16 DIAGNOSIS — R5383 Other fatigue: Secondary | ICD-10-CM | POA: Diagnosis not present

## 2020-05-16 DIAGNOSIS — E559 Vitamin D deficiency, unspecified: Secondary | ICD-10-CM | POA: Diagnosis not present

## 2020-05-16 DIAGNOSIS — Z0000 Encounter for general adult medical examination without abnormal findings: Secondary | ICD-10-CM | POA: Diagnosis not present

## 2020-05-16 DIAGNOSIS — Z7189 Other specified counseling: Secondary | ICD-10-CM | POA: Diagnosis not present

## 2020-05-16 DIAGNOSIS — R030 Elevated blood-pressure reading, without diagnosis of hypertension: Secondary | ICD-10-CM | POA: Diagnosis not present

## 2020-05-16 DIAGNOSIS — Z299 Encounter for prophylactic measures, unspecified: Secondary | ICD-10-CM | POA: Diagnosis not present

## 2020-05-16 DIAGNOSIS — I10 Essential (primary) hypertension: Secondary | ICD-10-CM | POA: Diagnosis not present

## 2020-05-16 DIAGNOSIS — Z79899 Other long term (current) drug therapy: Secondary | ICD-10-CM | POA: Diagnosis not present

## 2020-06-04 DIAGNOSIS — J449 Chronic obstructive pulmonary disease, unspecified: Secondary | ICD-10-CM | POA: Diagnosis not present

## 2020-07-04 DIAGNOSIS — J449 Chronic obstructive pulmonary disease, unspecified: Secondary | ICD-10-CM | POA: Diagnosis not present

## 2020-07-11 DIAGNOSIS — R600 Localized edema: Secondary | ICD-10-CM | POA: Diagnosis not present

## 2020-07-11 DIAGNOSIS — E7800 Pure hypercholesterolemia, unspecified: Secondary | ICD-10-CM | POA: Diagnosis not present

## 2020-07-11 DIAGNOSIS — E1165 Type 2 diabetes mellitus with hyperglycemia: Secondary | ICD-10-CM | POA: Diagnosis not present

## 2020-07-11 DIAGNOSIS — I10 Essential (primary) hypertension: Secondary | ICD-10-CM | POA: Diagnosis not present

## 2020-08-04 DIAGNOSIS — J449 Chronic obstructive pulmonary disease, unspecified: Secondary | ICD-10-CM | POA: Diagnosis not present

## 2020-08-05 ENCOUNTER — Encounter: Payer: Self-pay | Admitting: Internal Medicine

## 2020-08-05 ENCOUNTER — Other Ambulatory Visit: Payer: Self-pay

## 2020-08-05 ENCOUNTER — Ambulatory Visit (INDEPENDENT_AMBULATORY_CARE_PROVIDER_SITE_OTHER): Payer: Medicare Other | Admitting: Internal Medicine

## 2020-08-05 DIAGNOSIS — J45991 Cough variant asthma: Secondary | ICD-10-CM | POA: Diagnosis not present

## 2020-08-05 NOTE — Assessment & Plan Note (Signed)
Onset 2017 in setting of very large [facility] 08/28/2018    try symbicort 160 2bid and 12 d course of pred/ add max gerd rx  - Allergy profile 08/28/2018 >  Eos 0.7 /  IgE  75  RAST neg - 12/10/2018  After extensive coaching inhaler device,  effectiveness =    25%  > continue symb 160 2bid back up with neb saba an prednisone if flares  And added bud 0.5 mg q am - 01/06/2019 prednisone x 6 days added to back up plans > not using pred or bud at ov - 09/09/2019  Added again rec for pred as plan C under ABC action plan and cont neb as plan B with Breo 100    All goals of chronic asthma control met including optimal function and elimination of symptoms with minimal need for rescue therapy.  Contingencies discussed in full including contacting this office immediately if not controlling the symptoms using the rule of two's.     Re saba: I spent extra time with pt today reviewing appropriate use of albuterol for prn use on exertion with the following points: 1) saba is for relief of sob that does not improve by walking a slower pace or resting but rather if the pt does not improve after trying this first. 2) If the pt is convinced, as many are, that saba helps recover from activity faster then it's easy to tell if this is the case by re-challenging : ie stop, take the inhaler, then p 5 minutes try the exact same activity (intensity of workload) that just caused the symptoms and see if they are substantially diminished or not after saba 3) if there is an activity that reproducibly causes the symptoms, try the saba 15 min before the activity on alternate days   If in fact the saba really does help, then fine to continue to use it prn but advised may need to look closer at the maintenance regimen being used to achieve better control of airways disease with exertion.    F/u yearly          Each maintenance medication was reviewed in detail including emphasizing most importantly the difference between maintenance  and prns and under what circumstances the prns are to be triggered using an action plan format where appropriate.  Total time for H and P, chart review, counseling, reviewing dpi/ hfa  device(s) and generating customized AVS unique to this office visit / same day charting =  27 min

## 2020-08-05 NOTE — Patient Instructions (Signed)
No change in medications   Please schedule a follow up visit in 12  months but call sooner if needed  
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
## 2020-08-11 DIAGNOSIS — I10 Essential (primary) hypertension: Secondary | ICD-10-CM | POA: Diagnosis not present

## 2020-08-11 DIAGNOSIS — R600 Localized edema: Secondary | ICD-10-CM | POA: Diagnosis not present

## 2020-08-11 DIAGNOSIS — E7800 Pure hypercholesterolemia, unspecified: Secondary | ICD-10-CM | POA: Diagnosis not present

## 2020-08-11 DIAGNOSIS — E1165 Type 2 diabetes mellitus with hyperglycemia: Secondary | ICD-10-CM | POA: Diagnosis not present

## 2020-09-03 DIAGNOSIS — J449 Chronic obstructive pulmonary disease, unspecified: Secondary | ICD-10-CM | POA: Diagnosis not present

## 2020-09-15 DIAGNOSIS — M549 Dorsalgia, unspecified: Secondary | ICD-10-CM | POA: Diagnosis not present

## 2020-09-15 DIAGNOSIS — Z299 Encounter for prophylactic measures, unspecified: Secondary | ICD-10-CM | POA: Diagnosis not present

## 2020-09-15 DIAGNOSIS — I10 Essential (primary) hypertension: Secondary | ICD-10-CM | POA: Diagnosis not present

## 2020-09-15 DIAGNOSIS — D692 Other nonthrombocytopenic purpura: Secondary | ICD-10-CM | POA: Diagnosis not present

## 2020-09-15 DIAGNOSIS — J45909 Unspecified asthma, uncomplicated: Secondary | ICD-10-CM | POA: Diagnosis not present

## 2020-10-04 DIAGNOSIS — J449 Chronic obstructive pulmonary disease, unspecified: Secondary | ICD-10-CM | POA: Diagnosis not present

## 2020-11-02 DIAGNOSIS — J309 Allergic rhinitis, unspecified: Secondary | ICD-10-CM | POA: Diagnosis not present

## 2020-11-02 DIAGNOSIS — K219 Gastro-esophageal reflux disease without esophagitis: Secondary | ICD-10-CM | POA: Diagnosis not present

## 2020-11-02 DIAGNOSIS — I10 Essential (primary) hypertension: Secondary | ICD-10-CM | POA: Diagnosis not present

## 2020-11-02 DIAGNOSIS — Z299 Encounter for prophylactic measures, unspecified: Secondary | ICD-10-CM | POA: Diagnosis not present

## 2020-11-04 DIAGNOSIS — J449 Chronic obstructive pulmonary disease, unspecified: Secondary | ICD-10-CM | POA: Diagnosis not present

## 2020-11-11 DIAGNOSIS — E1165 Type 2 diabetes mellitus with hyperglycemia: Secondary | ICD-10-CM | POA: Diagnosis not present

## 2020-11-11 DIAGNOSIS — I10 Essential (primary) hypertension: Secondary | ICD-10-CM | POA: Diagnosis not present

## 2020-11-14 DIAGNOSIS — Z299 Encounter for prophylactic measures, unspecified: Secondary | ICD-10-CM | POA: Diagnosis not present

## 2020-11-14 DIAGNOSIS — J209 Acute bronchitis, unspecified: Secondary | ICD-10-CM | POA: Diagnosis not present

## 2020-11-14 DIAGNOSIS — J40 Bronchitis, not specified as acute or chronic: Secondary | ICD-10-CM | POA: Diagnosis not present

## 2020-11-14 DIAGNOSIS — J45909 Unspecified asthma, uncomplicated: Secondary | ICD-10-CM | POA: Diagnosis not present

## 2020-11-14 DIAGNOSIS — N390 Urinary tract infection, site not specified: Secondary | ICD-10-CM | POA: Diagnosis not present

## 2020-12-04 DIAGNOSIS — J449 Chronic obstructive pulmonary disease, unspecified: Secondary | ICD-10-CM | POA: Diagnosis not present

## 2020-12-19 DIAGNOSIS — R140 Abdominal distension (gaseous): Secondary | ICD-10-CM | POA: Diagnosis not present

## 2020-12-19 DIAGNOSIS — I10 Essential (primary) hypertension: Secondary | ICD-10-CM | POA: Diagnosis not present

## 2020-12-19 DIAGNOSIS — B351 Tinea unguium: Secondary | ICD-10-CM | POA: Diagnosis not present

## 2020-12-19 DIAGNOSIS — R143 Flatulence: Secondary | ICD-10-CM | POA: Diagnosis not present

## 2020-12-19 DIAGNOSIS — Z299 Encounter for prophylactic measures, unspecified: Secondary | ICD-10-CM | POA: Diagnosis not present

## 2021-01-02 DIAGNOSIS — I739 Peripheral vascular disease, unspecified: Secondary | ICD-10-CM | POA: Diagnosis not present

## 2021-01-02 DIAGNOSIS — L110 Acquired keratosis follicularis: Secondary | ICD-10-CM | POA: Diagnosis not present

## 2021-01-02 DIAGNOSIS — M79671 Pain in right foot: Secondary | ICD-10-CM | POA: Diagnosis not present

## 2021-01-02 DIAGNOSIS — M79672 Pain in left foot: Secondary | ICD-10-CM | POA: Diagnosis not present

## 2021-01-04 DIAGNOSIS — J449 Chronic obstructive pulmonary disease, unspecified: Secondary | ICD-10-CM | POA: Diagnosis not present

## 2021-01-16 DIAGNOSIS — K219 Gastro-esophageal reflux disease without esophagitis: Secondary | ICD-10-CM | POA: Diagnosis not present

## 2021-01-16 DIAGNOSIS — Z299 Encounter for prophylactic measures, unspecified: Secondary | ICD-10-CM | POA: Diagnosis not present

## 2021-01-16 DIAGNOSIS — M549 Dorsalgia, unspecified: Secondary | ICD-10-CM | POA: Diagnosis not present

## 2021-01-16 DIAGNOSIS — I10 Essential (primary) hypertension: Secondary | ICD-10-CM | POA: Diagnosis not present

## 2021-02-03 DIAGNOSIS — J449 Chronic obstructive pulmonary disease, unspecified: Secondary | ICD-10-CM | POA: Diagnosis not present

## 2021-03-13 DIAGNOSIS — L110 Acquired keratosis follicularis: Secondary | ICD-10-CM | POA: Diagnosis not present

## 2021-03-13 DIAGNOSIS — M79671 Pain in right foot: Secondary | ICD-10-CM | POA: Diagnosis not present

## 2021-03-13 DIAGNOSIS — M79672 Pain in left foot: Secondary | ICD-10-CM | POA: Diagnosis not present

## 2021-03-13 DIAGNOSIS — I739 Peripheral vascular disease, unspecified: Secondary | ICD-10-CM | POA: Diagnosis not present

## 2021-03-14 DIAGNOSIS — Z789 Other specified health status: Secondary | ICD-10-CM | POA: Diagnosis not present

## 2021-03-14 DIAGNOSIS — Z299 Encounter for prophylactic measures, unspecified: Secondary | ICD-10-CM | POA: Diagnosis not present

## 2021-03-14 DIAGNOSIS — I10 Essential (primary) hypertension: Secondary | ICD-10-CM | POA: Diagnosis not present

## 2021-03-14 DIAGNOSIS — J309 Allergic rhinitis, unspecified: Secondary | ICD-10-CM | POA: Diagnosis not present

## 2021-03-20 DIAGNOSIS — Z789 Other specified health status: Secondary | ICD-10-CM | POA: Diagnosis not present

## 2021-03-20 DIAGNOSIS — Z299 Encounter for prophylactic measures, unspecified: Secondary | ICD-10-CM | POA: Diagnosis not present

## 2021-03-20 DIAGNOSIS — Z2821 Immunization not carried out because of patient refusal: Secondary | ICD-10-CM | POA: Diagnosis not present

## 2021-03-20 DIAGNOSIS — J9611 Chronic respiratory failure with hypoxia: Secondary | ICD-10-CM | POA: Diagnosis not present

## 2021-03-20 DIAGNOSIS — I10 Essential (primary) hypertension: Secondary | ICD-10-CM | POA: Diagnosis not present

## 2021-05-31 DIAGNOSIS — Z299 Encounter for prophylactic measures, unspecified: Secondary | ICD-10-CM | POA: Diagnosis not present

## 2021-05-31 DIAGNOSIS — M549 Dorsalgia, unspecified: Secondary | ICD-10-CM | POA: Diagnosis not present

## 2021-05-31 DIAGNOSIS — Z79899 Other long term (current) drug therapy: Secondary | ICD-10-CM | POA: Diagnosis not present

## 2021-05-31 DIAGNOSIS — Z0000 Encounter for general adult medical examination without abnormal findings: Secondary | ICD-10-CM | POA: Diagnosis not present

## 2021-05-31 DIAGNOSIS — I10 Essential (primary) hypertension: Secondary | ICD-10-CM | POA: Diagnosis not present

## 2021-05-31 DIAGNOSIS — E559 Vitamin D deficiency, unspecified: Secondary | ICD-10-CM | POA: Diagnosis not present

## 2021-05-31 DIAGNOSIS — Z7189 Other specified counseling: Secondary | ICD-10-CM | POA: Diagnosis not present

## 2021-05-31 DIAGNOSIS — R5383 Other fatigue: Secondary | ICD-10-CM | POA: Diagnosis not present

## 2021-05-31 DIAGNOSIS — K529 Noninfective gastroenteritis and colitis, unspecified: Secondary | ICD-10-CM | POA: Diagnosis not present

## 2021-05-31 DIAGNOSIS — I700 Atherosclerosis of aorta: Secondary | ICD-10-CM | POA: Diagnosis not present

## 2021-05-31 DIAGNOSIS — Z789 Other specified health status: Secondary | ICD-10-CM | POA: Diagnosis not present

## 2021-06-03 DIAGNOSIS — Z28310 Unvaccinated for covid-19: Secondary | ICD-10-CM | POA: Diagnosis not present

## 2021-06-03 DIAGNOSIS — D72829 Elevated white blood cell count, unspecified: Secondary | ICD-10-CM | POA: Diagnosis not present

## 2021-06-03 DIAGNOSIS — Z792 Long term (current) use of antibiotics: Secondary | ICD-10-CM | POA: Diagnosis not present

## 2021-06-03 DIAGNOSIS — K5900 Constipation, unspecified: Secondary | ICD-10-CM | POA: Diagnosis not present

## 2021-06-03 DIAGNOSIS — R918 Other nonspecific abnormal finding of lung field: Secondary | ICD-10-CM | POA: Diagnosis not present

## 2021-06-03 DIAGNOSIS — R0602 Shortness of breath: Secondary | ICD-10-CM | POA: Diagnosis not present

## 2021-06-03 DIAGNOSIS — K449 Diaphragmatic hernia without obstruction or gangrene: Secondary | ICD-10-CM | POA: Diagnosis not present

## 2021-06-03 DIAGNOSIS — I340 Nonrheumatic mitral (valve) insufficiency: Secondary | ICD-10-CM | POA: Diagnosis not present

## 2021-06-03 DIAGNOSIS — I272 Pulmonary hypertension, unspecified: Secondary | ICD-10-CM | POA: Diagnosis not present

## 2021-06-03 DIAGNOSIS — Z79899 Other long term (current) drug therapy: Secondary | ICD-10-CM | POA: Diagnosis not present

## 2021-06-03 DIAGNOSIS — R0902 Hypoxemia: Secondary | ICD-10-CM | POA: Diagnosis not present

## 2021-06-03 DIAGNOSIS — J45901 Unspecified asthma with (acute) exacerbation: Secondary | ICD-10-CM | POA: Diagnosis not present

## 2021-06-03 DIAGNOSIS — I519 Heart disease, unspecified: Secondary | ICD-10-CM | POA: Diagnosis not present

## 2021-06-03 DIAGNOSIS — Z7951 Long term (current) use of inhaled steroids: Secondary | ICD-10-CM | POA: Diagnosis not present

## 2021-06-03 DIAGNOSIS — J9601 Acute respiratory failure with hypoxia: Secondary | ICD-10-CM | POA: Diagnosis not present

## 2021-06-03 DIAGNOSIS — E878 Other disorders of electrolyte and fluid balance, not elsewhere classified: Secondary | ICD-10-CM | POA: Diagnosis not present

## 2021-06-03 DIAGNOSIS — E876 Hypokalemia: Secondary | ICD-10-CM | POA: Diagnosis not present

## 2021-06-03 DIAGNOSIS — R0600 Dyspnea, unspecified: Secondary | ICD-10-CM | POA: Diagnosis not present

## 2021-06-03 DIAGNOSIS — J209 Acute bronchitis, unspecified: Secondary | ICD-10-CM | POA: Diagnosis not present

## 2021-06-03 DIAGNOSIS — I3481 Nonrheumatic mitral (valve) annulus calcification: Secondary | ICD-10-CM | POA: Diagnosis not present

## 2021-06-03 DIAGNOSIS — Z20822 Contact with and (suspected) exposure to covid-19: Secondary | ICD-10-CM | POA: Diagnosis not present

## 2021-06-03 DIAGNOSIS — A419 Sepsis, unspecified organism: Secondary | ICD-10-CM | POA: Diagnosis not present

## 2021-06-03 DIAGNOSIS — R791 Abnormal coagulation profile: Secondary | ICD-10-CM | POA: Diagnosis not present

## 2021-06-03 DIAGNOSIS — I10 Essential (primary) hypertension: Secondary | ICD-10-CM | POA: Diagnosis not present

## 2021-06-03 DIAGNOSIS — Z888 Allergy status to other drugs, medicaments and biological substances status: Secondary | ICD-10-CM | POA: Diagnosis not present

## 2021-06-03 DIAGNOSIS — J42 Unspecified chronic bronchitis: Secondary | ICD-10-CM | POA: Diagnosis not present

## 2021-06-03 DIAGNOSIS — Z9981 Dependence on supplemental oxygen: Secondary | ICD-10-CM | POA: Diagnosis not present

## 2021-06-03 DIAGNOSIS — R531 Weakness: Secondary | ICD-10-CM | POA: Diagnosis not present

## 2021-06-04 DIAGNOSIS — J209 Acute bronchitis, unspecified: Secondary | ICD-10-CM | POA: Diagnosis not present

## 2021-06-04 DIAGNOSIS — K449 Diaphragmatic hernia without obstruction or gangrene: Secondary | ICD-10-CM | POA: Diagnosis not present

## 2021-06-04 DIAGNOSIS — A419 Sepsis, unspecified organism: Secondary | ICD-10-CM | POA: Diagnosis not present

## 2021-06-04 DIAGNOSIS — R791 Abnormal coagulation profile: Secondary | ICD-10-CM | POA: Diagnosis not present

## 2021-06-04 DIAGNOSIS — I519 Heart disease, unspecified: Secondary | ICD-10-CM | POA: Diagnosis not present

## 2021-06-04 DIAGNOSIS — E876 Hypokalemia: Secondary | ICD-10-CM | POA: Diagnosis not present

## 2021-06-04 DIAGNOSIS — J42 Unspecified chronic bronchitis: Secondary | ICD-10-CM | POA: Diagnosis not present

## 2021-06-04 DIAGNOSIS — Z79899 Other long term (current) drug therapy: Secondary | ICD-10-CM | POA: Diagnosis not present

## 2021-06-04 DIAGNOSIS — J9601 Acute respiratory failure with hypoxia: Secondary | ICD-10-CM | POA: Diagnosis not present

## 2021-06-04 DIAGNOSIS — R0902 Hypoxemia: Secondary | ICD-10-CM | POA: Diagnosis not present

## 2021-06-04 DIAGNOSIS — Z792 Long term (current) use of antibiotics: Secondary | ICD-10-CM | POA: Diagnosis not present

## 2021-06-05 DIAGNOSIS — J9601 Acute respiratory failure with hypoxia: Secondary | ICD-10-CM | POA: Diagnosis not present

## 2021-06-05 DIAGNOSIS — Z792 Long term (current) use of antibiotics: Secondary | ICD-10-CM | POA: Diagnosis not present

## 2021-06-05 DIAGNOSIS — Z9981 Dependence on supplemental oxygen: Secondary | ICD-10-CM | POA: Diagnosis not present

## 2021-06-05 DIAGNOSIS — Z79899 Other long term (current) drug therapy: Secondary | ICD-10-CM | POA: Diagnosis not present

## 2021-06-06 DIAGNOSIS — R0600 Dyspnea, unspecified: Secondary | ICD-10-CM | POA: Diagnosis not present

## 2021-06-06 DIAGNOSIS — R0902 Hypoxemia: Secondary | ICD-10-CM | POA: Diagnosis not present

## 2021-06-06 DIAGNOSIS — I3481 Nonrheumatic mitral (valve) annulus calcification: Secondary | ICD-10-CM | POA: Diagnosis not present

## 2021-06-06 DIAGNOSIS — Z9981 Dependence on supplemental oxygen: Secondary | ICD-10-CM | POA: Diagnosis not present

## 2021-06-06 DIAGNOSIS — I340 Nonrheumatic mitral (valve) insufficiency: Secondary | ICD-10-CM | POA: Diagnosis not present

## 2021-06-06 DIAGNOSIS — J9601 Acute respiratory failure with hypoxia: Secondary | ICD-10-CM | POA: Diagnosis not present

## 2021-06-07 DIAGNOSIS — Z9981 Dependence on supplemental oxygen: Secondary | ICD-10-CM | POA: Diagnosis not present

## 2021-06-07 DIAGNOSIS — D72829 Elevated white blood cell count, unspecified: Secondary | ICD-10-CM | POA: Diagnosis not present

## 2021-06-07 DIAGNOSIS — J9601 Acute respiratory failure with hypoxia: Secondary | ICD-10-CM | POA: Diagnosis not present

## 2021-06-07 DIAGNOSIS — I10 Essential (primary) hypertension: Secondary | ICD-10-CM | POA: Diagnosis not present

## 2021-06-07 DIAGNOSIS — Z79899 Other long term (current) drug therapy: Secondary | ICD-10-CM | POA: Diagnosis not present

## 2021-06-09 DIAGNOSIS — J9601 Acute respiratory failure with hypoxia: Secondary | ICD-10-CM | POA: Diagnosis not present

## 2021-06-09 DIAGNOSIS — R0902 Hypoxemia: Secondary | ICD-10-CM | POA: Diagnosis not present

## 2021-06-23 DIAGNOSIS — Z299 Encounter for prophylactic measures, unspecified: Secondary | ICD-10-CM | POA: Diagnosis not present

## 2021-06-23 DIAGNOSIS — K219 Gastro-esophageal reflux disease without esophagitis: Secondary | ICD-10-CM | POA: Diagnosis not present

## 2021-06-23 DIAGNOSIS — I10 Essential (primary) hypertension: Secondary | ICD-10-CM | POA: Diagnosis not present

## 2021-06-23 DIAGNOSIS — Z789 Other specified health status: Secondary | ICD-10-CM | POA: Diagnosis not present

## 2021-07-04 DIAGNOSIS — M79671 Pain in right foot: Secondary | ICD-10-CM | POA: Diagnosis not present

## 2021-07-04 DIAGNOSIS — L110 Acquired keratosis follicularis: Secondary | ICD-10-CM | POA: Diagnosis not present

## 2021-07-04 DIAGNOSIS — M79672 Pain in left foot: Secondary | ICD-10-CM | POA: Diagnosis not present

## 2021-07-04 DIAGNOSIS — I739 Peripheral vascular disease, unspecified: Secondary | ICD-10-CM | POA: Diagnosis not present

## 2021-07-09 DIAGNOSIS — J9601 Acute respiratory failure with hypoxia: Secondary | ICD-10-CM | POA: Diagnosis not present

## 2021-07-09 DIAGNOSIS — R0902 Hypoxemia: Secondary | ICD-10-CM | POA: Diagnosis not present

## 2021-07-17 DIAGNOSIS — I10 Essential (primary) hypertension: Secondary | ICD-10-CM | POA: Diagnosis not present

## 2021-07-17 DIAGNOSIS — J45909 Unspecified asthma, uncomplicated: Secondary | ICD-10-CM | POA: Diagnosis not present

## 2021-07-17 DIAGNOSIS — Z299 Encounter for prophylactic measures, unspecified: Secondary | ICD-10-CM | POA: Diagnosis not present

## 2021-07-17 DIAGNOSIS — Z713 Dietary counseling and surveillance: Secondary | ICD-10-CM | POA: Diagnosis not present

## 2021-07-24 DIAGNOSIS — J45909 Unspecified asthma, uncomplicated: Secondary | ICD-10-CM | POA: Diagnosis not present

## 2021-07-24 DIAGNOSIS — I700 Atherosclerosis of aorta: Secondary | ICD-10-CM | POA: Diagnosis not present

## 2021-07-24 DIAGNOSIS — Z299 Encounter for prophylactic measures, unspecified: Secondary | ICD-10-CM | POA: Diagnosis not present

## 2021-07-24 DIAGNOSIS — I10 Essential (primary) hypertension: Secondary | ICD-10-CM | POA: Diagnosis not present

## 2021-08-04 DIAGNOSIS — J449 Chronic obstructive pulmonary disease, unspecified: Secondary | ICD-10-CM | POA: Diagnosis not present

## 2021-08-11 DIAGNOSIS — E1165 Type 2 diabetes mellitus with hyperglycemia: Secondary | ICD-10-CM | POA: Diagnosis not present

## 2021-08-11 DIAGNOSIS — E7800 Pure hypercholesterolemia, unspecified: Secondary | ICD-10-CM | POA: Diagnosis not present

## 2021-08-11 DIAGNOSIS — I10 Essential (primary) hypertension: Secondary | ICD-10-CM | POA: Diagnosis not present

## 2021-08-11 DIAGNOSIS — R600 Localized edema: Secondary | ICD-10-CM | POA: Diagnosis not present

## 2021-08-21 DIAGNOSIS — I10 Essential (primary) hypertension: Secondary | ICD-10-CM | POA: Diagnosis not present

## 2021-08-21 DIAGNOSIS — Z789 Other specified health status: Secondary | ICD-10-CM | POA: Diagnosis not present

## 2021-08-21 DIAGNOSIS — J45909 Unspecified asthma, uncomplicated: Secondary | ICD-10-CM | POA: Diagnosis not present

## 2021-08-21 DIAGNOSIS — Z299 Encounter for prophylactic measures, unspecified: Secondary | ICD-10-CM | POA: Diagnosis not present

## 2021-09-06 DIAGNOSIS — R252 Cramp and spasm: Secondary | ICD-10-CM | POA: Diagnosis not present

## 2021-09-06 DIAGNOSIS — J45909 Unspecified asthma, uncomplicated: Secondary | ICD-10-CM | POA: Diagnosis not present

## 2021-09-06 DIAGNOSIS — K219 Gastro-esophageal reflux disease without esophagitis: Secondary | ICD-10-CM | POA: Diagnosis not present

## 2021-09-06 DIAGNOSIS — Z299 Encounter for prophylactic measures, unspecified: Secondary | ICD-10-CM | POA: Diagnosis not present

## 2021-09-06 DIAGNOSIS — I10 Essential (primary) hypertension: Secondary | ICD-10-CM | POA: Diagnosis not present

## 2021-09-09 DIAGNOSIS — Z9981 Dependence on supplemental oxygen: Secondary | ICD-10-CM | POA: Diagnosis not present

## 2021-09-09 DIAGNOSIS — J9601 Acute respiratory failure with hypoxia: Secondary | ICD-10-CM | POA: Diagnosis not present

## 2021-09-09 DIAGNOSIS — R6520 Severe sepsis without septic shock: Secondary | ICD-10-CM | POA: Diagnosis not present

## 2021-09-09 DIAGNOSIS — I10 Essential (primary) hypertension: Secondary | ICD-10-CM | POA: Diagnosis not present

## 2021-09-09 DIAGNOSIS — Z7982 Long term (current) use of aspirin: Secondary | ICD-10-CM | POA: Diagnosis not present

## 2021-09-09 DIAGNOSIS — K449 Diaphragmatic hernia without obstruction or gangrene: Secondary | ICD-10-CM | POA: Diagnosis not present

## 2021-09-09 DIAGNOSIS — J449 Chronic obstructive pulmonary disease, unspecified: Secondary | ICD-10-CM | POA: Diagnosis not present

## 2021-09-09 DIAGNOSIS — R059 Cough, unspecified: Secondary | ICD-10-CM | POA: Diagnosis not present

## 2021-09-09 DIAGNOSIS — A419 Sepsis, unspecified organism: Secondary | ICD-10-CM | POA: Diagnosis not present

## 2021-09-09 DIAGNOSIS — J45901 Unspecified asthma with (acute) exacerbation: Secondary | ICD-10-CM | POA: Diagnosis not present

## 2021-09-09 DIAGNOSIS — K219 Gastro-esophageal reflux disease without esophagitis: Secondary | ICD-10-CM | POA: Diagnosis not present

## 2021-09-09 DIAGNOSIS — Z79899 Other long term (current) drug therapy: Secondary | ICD-10-CM | POA: Diagnosis not present

## 2021-09-09 DIAGNOSIS — R0902 Hypoxemia: Secondary | ICD-10-CM | POA: Diagnosis not present

## 2021-09-09 DIAGNOSIS — Z792 Long term (current) use of antibiotics: Secondary | ICD-10-CM | POA: Diagnosis not present

## 2021-09-09 DIAGNOSIS — Z7951 Long term (current) use of inhaled steroids: Secondary | ICD-10-CM | POA: Diagnosis not present

## 2021-09-09 DIAGNOSIS — Z20822 Contact with and (suspected) exposure to covid-19: Secondary | ICD-10-CM | POA: Diagnosis not present

## 2021-09-12 DIAGNOSIS — J9601 Acute respiratory failure with hypoxia: Secondary | ICD-10-CM | POA: Diagnosis not present

## 2021-09-12 DIAGNOSIS — Z792 Long term (current) use of antibiotics: Secondary | ICD-10-CM | POA: Diagnosis not present

## 2021-09-12 DIAGNOSIS — K449 Diaphragmatic hernia without obstruction or gangrene: Secondary | ICD-10-CM | POA: Diagnosis not present

## 2021-09-12 DIAGNOSIS — J449 Chronic obstructive pulmonary disease, unspecified: Secondary | ICD-10-CM | POA: Diagnosis not present

## 2021-09-12 DIAGNOSIS — K219 Gastro-esophageal reflux disease without esophagitis: Secondary | ICD-10-CM | POA: Diagnosis not present

## 2021-09-12 DIAGNOSIS — Z9981 Dependence on supplemental oxygen: Secondary | ICD-10-CM | POA: Diagnosis not present

## 2021-09-12 DIAGNOSIS — J45901 Unspecified asthma with (acute) exacerbation: Secondary | ICD-10-CM | POA: Diagnosis not present

## 2021-09-13 DIAGNOSIS — J9601 Acute respiratory failure with hypoxia: Secondary | ICD-10-CM | POA: Diagnosis not present

## 2021-09-13 DIAGNOSIS — J45901 Unspecified asthma with (acute) exacerbation: Secondary | ICD-10-CM | POA: Diagnosis not present

## 2021-09-13 DIAGNOSIS — R0902 Hypoxemia: Secondary | ICD-10-CM | POA: Diagnosis not present

## 2021-09-19 DIAGNOSIS — M79671 Pain in right foot: Secondary | ICD-10-CM | POA: Diagnosis not present

## 2021-09-19 DIAGNOSIS — I739 Peripheral vascular disease, unspecified: Secondary | ICD-10-CM | POA: Diagnosis not present

## 2021-09-19 DIAGNOSIS — M79672 Pain in left foot: Secondary | ICD-10-CM | POA: Diagnosis not present

## 2021-09-19 DIAGNOSIS — L110 Acquired keratosis follicularis: Secondary | ICD-10-CM | POA: Diagnosis not present

## 2021-09-21 DIAGNOSIS — J45909 Unspecified asthma, uncomplicated: Secondary | ICD-10-CM | POA: Diagnosis not present

## 2021-09-21 DIAGNOSIS — J9611 Chronic respiratory failure with hypoxia: Secondary | ICD-10-CM | POA: Diagnosis not present

## 2021-09-21 DIAGNOSIS — Z299 Encounter for prophylactic measures, unspecified: Secondary | ICD-10-CM | POA: Diagnosis not present

## 2021-09-21 DIAGNOSIS — I10 Essential (primary) hypertension: Secondary | ICD-10-CM | POA: Diagnosis not present

## 2021-09-21 DIAGNOSIS — I700 Atherosclerosis of aorta: Secondary | ICD-10-CM | POA: Diagnosis not present

## 2021-10-19 DIAGNOSIS — I700 Atherosclerosis of aorta: Secondary | ICD-10-CM | POA: Diagnosis not present

## 2021-10-19 DIAGNOSIS — Z299 Encounter for prophylactic measures, unspecified: Secondary | ICD-10-CM | POA: Diagnosis not present

## 2021-10-19 DIAGNOSIS — K219 Gastro-esophageal reflux disease without esophagitis: Secondary | ICD-10-CM | POA: Diagnosis not present

## 2021-10-19 DIAGNOSIS — M549 Dorsalgia, unspecified: Secondary | ICD-10-CM | POA: Diagnosis not present

## 2021-10-19 DIAGNOSIS — I10 Essential (primary) hypertension: Secondary | ICD-10-CM | POA: Diagnosis not present

## 2021-10-31 DIAGNOSIS — B372 Candidiasis of skin and nail: Secondary | ICD-10-CM | POA: Diagnosis not present

## 2021-10-31 DIAGNOSIS — I10 Essential (primary) hypertension: Secondary | ICD-10-CM | POA: Diagnosis not present

## 2021-10-31 DIAGNOSIS — Z299 Encounter for prophylactic measures, unspecified: Secondary | ICD-10-CM | POA: Diagnosis not present

## 2021-11-04 DIAGNOSIS — J449 Chronic obstructive pulmonary disease, unspecified: Secondary | ICD-10-CM | POA: Diagnosis not present

## 2021-11-06 DIAGNOSIS — B372 Candidiasis of skin and nail: Secondary | ICD-10-CM | POA: Diagnosis not present

## 2021-11-06 DIAGNOSIS — I10 Essential (primary) hypertension: Secondary | ICD-10-CM | POA: Diagnosis not present

## 2021-11-06 DIAGNOSIS — Z299 Encounter for prophylactic measures, unspecified: Secondary | ICD-10-CM | POA: Diagnosis not present

## 2021-11-28 DIAGNOSIS — I739 Peripheral vascular disease, unspecified: Secondary | ICD-10-CM | POA: Diagnosis not present

## 2021-11-28 DIAGNOSIS — M79672 Pain in left foot: Secondary | ICD-10-CM | POA: Diagnosis not present

## 2021-11-28 DIAGNOSIS — L110 Acquired keratosis follicularis: Secondary | ICD-10-CM | POA: Diagnosis not present

## 2021-11-28 DIAGNOSIS — M79671 Pain in right foot: Secondary | ICD-10-CM | POA: Diagnosis not present

## 2021-12-04 DIAGNOSIS — J449 Chronic obstructive pulmonary disease, unspecified: Secondary | ICD-10-CM | POA: Diagnosis not present

## 2021-12-07 DIAGNOSIS — Z2821 Immunization not carried out because of patient refusal: Secondary | ICD-10-CM | POA: Diagnosis not present

## 2021-12-07 DIAGNOSIS — Z0000 Encounter for general adult medical examination without abnormal findings: Secondary | ICD-10-CM | POA: Diagnosis not present

## 2021-12-07 DIAGNOSIS — I10 Essential (primary) hypertension: Secondary | ICD-10-CM | POA: Diagnosis not present

## 2021-12-07 DIAGNOSIS — Z299 Encounter for prophylactic measures, unspecified: Secondary | ICD-10-CM | POA: Diagnosis not present

## 2021-12-07 DIAGNOSIS — I700 Atherosclerosis of aorta: Secondary | ICD-10-CM | POA: Diagnosis not present

## 2022-01-04 DIAGNOSIS — J449 Chronic obstructive pulmonary disease, unspecified: Secondary | ICD-10-CM | POA: Diagnosis not present

## 2022-02-03 DIAGNOSIS — J449 Chronic obstructive pulmonary disease, unspecified: Secondary | ICD-10-CM | POA: Diagnosis not present

## 2022-03-20 DIAGNOSIS — I700 Atherosclerosis of aorta: Secondary | ICD-10-CM | POA: Diagnosis not present

## 2022-03-20 DIAGNOSIS — J45909 Unspecified asthma, uncomplicated: Secondary | ICD-10-CM | POA: Diagnosis not present

## 2022-03-20 DIAGNOSIS — M549 Dorsalgia, unspecified: Secondary | ICD-10-CM | POA: Diagnosis not present

## 2022-03-20 DIAGNOSIS — I10 Essential (primary) hypertension: Secondary | ICD-10-CM | POA: Diagnosis not present

## 2022-03-20 DIAGNOSIS — R350 Frequency of micturition: Secondary | ICD-10-CM | POA: Diagnosis not present

## 2022-03-20 DIAGNOSIS — Z299 Encounter for prophylactic measures, unspecified: Secondary | ICD-10-CM | POA: Diagnosis not present

## 2022-03-20 DIAGNOSIS — Z6828 Body mass index (BMI) 28.0-28.9, adult: Secondary | ICD-10-CM | POA: Diagnosis not present

## 2022-03-26 DIAGNOSIS — K5289 Other specified noninfective gastroenteritis and colitis: Secondary | ICD-10-CM | POA: Diagnosis not present

## 2022-03-26 DIAGNOSIS — R1031 Right lower quadrant pain: Secondary | ICD-10-CM | POA: Diagnosis not present

## 2022-03-26 DIAGNOSIS — R109 Unspecified abdominal pain: Secondary | ICD-10-CM | POA: Diagnosis not present

## 2022-03-26 DIAGNOSIS — K529 Noninfective gastroenteritis and colitis, unspecified: Secondary | ICD-10-CM | POA: Diagnosis not present

## 2022-03-26 DIAGNOSIS — I700 Atherosclerosis of aorta: Secondary | ICD-10-CM | POA: Diagnosis not present

## 2022-03-26 DIAGNOSIS — Z7722 Contact with and (suspected) exposure to environmental tobacco smoke (acute) (chronic): Secondary | ICD-10-CM | POA: Diagnosis not present

## 2022-03-26 DIAGNOSIS — K449 Diaphragmatic hernia without obstruction or gangrene: Secondary | ICD-10-CM | POA: Diagnosis not present

## 2022-03-26 DIAGNOSIS — K5900 Constipation, unspecified: Secondary | ICD-10-CM | POA: Diagnosis not present

## 2022-03-28 DIAGNOSIS — K219 Gastro-esophageal reflux disease without esophagitis: Secondary | ICD-10-CM | POA: Diagnosis not present

## 2022-03-28 DIAGNOSIS — K5792 Diverticulitis of intestine, part unspecified, without perforation or abscess without bleeding: Secondary | ICD-10-CM | POA: Diagnosis not present

## 2022-03-28 DIAGNOSIS — Z299 Encounter for prophylactic measures, unspecified: Secondary | ICD-10-CM | POA: Diagnosis not present

## 2022-03-28 DIAGNOSIS — I10 Essential (primary) hypertension: Secondary | ICD-10-CM | POA: Diagnosis not present

## 2022-03-28 DIAGNOSIS — Z6827 Body mass index (BMI) 27.0-27.9, adult: Secondary | ICD-10-CM | POA: Diagnosis not present

## 2022-03-30 DIAGNOSIS — R5383 Other fatigue: Secondary | ICD-10-CM | POA: Diagnosis not present

## 2022-03-30 DIAGNOSIS — K5792 Diverticulitis of intestine, part unspecified, without perforation or abscess without bleeding: Secondary | ICD-10-CM | POA: Diagnosis not present

## 2022-03-30 DIAGNOSIS — Z299 Encounter for prophylactic measures, unspecified: Secondary | ICD-10-CM | POA: Diagnosis not present

## 2022-03-31 ENCOUNTER — Encounter (HOSPITAL_COMMUNITY): Payer: Self-pay

## 2022-03-31 ENCOUNTER — Emergency Department (HOSPITAL_COMMUNITY)
Admission: EM | Admit: 2022-03-31 | Discharge: 2022-03-31 | Disposition: A | Discharge status: Home / Self Care | Payer: Medicare HMO | Attending: Emergency Medicine | Admitting: Emergency Medicine

## 2022-03-31 ENCOUNTER — Other Ambulatory Visit: Payer: Self-pay

## 2022-03-31 ENCOUNTER — Emergency Department (HOSPITAL_COMMUNITY): Payer: Medicare HMO

## 2022-03-31 DIAGNOSIS — Z7982 Long term (current) use of aspirin: Secondary | ICD-10-CM | POA: Diagnosis not present

## 2022-03-31 DIAGNOSIS — K5732 Diverticulitis of large intestine without perforation or abscess without bleeding: Secondary | ICD-10-CM | POA: Diagnosis not present

## 2022-03-31 DIAGNOSIS — R1030 Lower abdominal pain, unspecified: Secondary | ICD-10-CM | POA: Diagnosis present

## 2022-03-31 DIAGNOSIS — I10 Essential (primary) hypertension: Secondary | ICD-10-CM | POA: Insufficient documentation

## 2022-03-31 DIAGNOSIS — E876 Hypokalemia: Secondary | ICD-10-CM | POA: Insufficient documentation

## 2022-03-31 DIAGNOSIS — K5730 Diverticulosis of large intestine without perforation or abscess without bleeding: Secondary | ICD-10-CM | POA: Diagnosis not present

## 2022-03-31 DIAGNOSIS — N200 Calculus of kidney: Secondary | ICD-10-CM | POA: Diagnosis not present

## 2022-03-31 DIAGNOSIS — J45909 Unspecified asthma, uncomplicated: Secondary | ICD-10-CM | POA: Insufficient documentation

## 2022-03-31 LAB — COMPREHENSIVE METABOLIC PANEL
ALT: 5 U/L (ref 0–44)
AST: 17 U/L (ref 15–41)
Albumin: 3.6 g/dL (ref 3.5–5.0)
Alkaline Phosphatase: 79 U/L (ref 38–126)
Anion gap: 9 (ref 5–15)
BUN: 12 mg/dL (ref 8–23)
CO2: 30 mmol/L (ref 22–32)
Calcium: 8.9 mg/dL (ref 8.9–10.3)
Chloride: 99 mmol/L (ref 98–111)
Creatinine, Ser: 0.95 mg/dL (ref 0.44–1.00)
GFR, Estimated: 59 mL/min — ABNORMAL LOW (ref >60)
Glucose, Bld: 103 mg/dL — ABNORMAL HIGH (ref 70–99)
Potassium: 3.1 mmol/L — ABNORMAL LOW (ref 3.5–5.1)
Sodium: 138 mmol/L (ref 135–145)
Total Bilirubin: 0.6 mg/dL (ref 0.3–1.2)
Total Protein: 7.3 g/dL (ref 6.5–8.1)

## 2022-03-31 LAB — URINALYSIS, ROUTINE W REFLEX MICROSCOPIC
Bacteria, UA: NONE SEEN
Bilirubin Urine: NEGATIVE
Glucose, UA: NEGATIVE mg/dL
Hgb urine dipstick: NEGATIVE
Ketones, ur: NEGATIVE mg/dL
Nitrite: NEGATIVE
Protein, ur: NEGATIVE mg/dL
Specific Gravity, Urine: 1.019 (ref 1.005–1.030)
pH: 6 (ref 5.0–8.0)

## 2022-03-31 LAB — CBC
HCT: 43.2 % (ref 36.0–46.0)
Hemoglobin: 13.7 g/dL (ref 12.0–15.0)
MCH: 28.2 pg (ref 26.0–34.0)
MCHC: 31.7 g/dL (ref 30.0–36.0)
MCV: 89.1 fL (ref 80.0–100.0)
Platelets: 297 10*3/uL (ref 150–400)
RBC: 4.85 MIL/uL (ref 3.87–5.11)
RDW: 13.2 % (ref 11.5–15.5)
WBC: 6.7 10*3/uL (ref 4.0–10.5)
nRBC: 0 % (ref 0.0–0.2)

## 2022-03-31 LAB — LIPASE, BLOOD: Lipase: 36 U/L (ref 11–51)

## 2022-03-31 MED ORDER — POTASSIUM CHLORIDE CRYS ER 20 MEQ PO TBCR
40.0000 meq | EXTENDED_RELEASE_TABLET | Freq: Once | ORAL | Status: AC
  Administered 2022-03-31: 40 meq via ORAL
  Filled 2022-03-31: qty 2

## 2022-03-31 MED ORDER — IOHEXOL 300 MG/ML  SOLN
100.0000 mL | Freq: Once | INTRAMUSCULAR | Status: AC | PRN
  Administered 2022-03-31: 75 mL via INTRAVENOUS

## 2022-03-31 NOTE — Discharge Instructions (Signed)
You were seen today and your CT scan shows diverticulitis.  Your labs have improved since your last ED visit.  Your potassium was slightly low and we did give you some medication to replace this.  Make sure you are getting enough potassium in your diet with things like bananas or orange juice. Continue the medications prescribed by your primary care doctor.  Follow-up with him.  You can also follow-up with a gastroenterologist they may want to do a colonoscopy in 6 to 8 weeks

## 2022-03-31 NOTE — ED Provider Notes (Signed)
[facility] Provider Note   CSN: [id number] Arrival date & time: 03/31/22  1502     History  Chief Complaint  Patient presents with   Abdominal Pain    Mary Solis is a 82 y.o. female.  Past medical history of hypertension asthma and peripheral vascular disease.  She was seen in the ED on February 12 at [facility] and had a CT showing stercoral colitis.  He was not given any medications to go home at that time but she follow-up with her primary care doctor on Wednesday and was started on Cipro and Flagyl. She states that the sharp severe lower abdominal pain has gone away but her abdomen still does not feel right and she still having some pain.  She denies nausea or vomiting or fevers.  She has been compliant with her antibiotics.   Abdominal Pain      Home Medications Prior to Admission medications   Medication Sig Start Date End Date Taking? Authorizing Provider  albuterol (PROVENTIL) (2.5 MG/3ML) 0.083% nebulizer solution Use up  to every 4 hours if needed 11/05/18   Tanda Rockers, MD  aspirin 325 MG EC tablet Take 325 mg by mouth daily.    [provider]  Cranberry 1000 MG CAPS Take 1 capsule by mouth daily.    [provider]  fexofenadine (ALLEGRA) 180 MG tablet Take 180 mg by mouth daily.    [provider]  fluticasone furoate-vilanterol (BREO ELLIPTA) 100-25 MCG/INH AEPB Inhale 1 puff into the lungs daily.    [provider]      Allergies    Ibuprofen    Review of Systems   Review of Systems  Gastrointestinal:  Positive for abdominal pain.    Physical Exam Updated Vital Signs BP (!) 159/74   Pulse 84   Temp 98 F (36.7 C) (Oral)   Resp 18   Ht 4' 8"$  (1.422 m)   Wt 59.4 kg   SpO2 96%   BMI 29.37 kg/m  Physical Exam Vitals and nursing note reviewed.  Constitutional:      General: She is not in acute distress.    Appearance: She is well-developed.  HENT:      Head: Normocephalic and atraumatic.  Eyes:     Extraocular Movements: Extraocular movements intact.     Conjunctiva/sclera: Conjunctivae normal.  Cardiovascular:     Rate and Rhythm: Normal rate and regular rhythm.     Heart sounds: No murmur heard. Pulmonary:     Effort: Pulmonary effort is normal. No respiratory distress.     Breath sounds: Normal breath sounds.  Abdominal:     General: Abdomen is flat.     Palpations: Abdomen is soft.     Tenderness: There is abdominal tenderness.     Comments: Mild diffuse abdominal tenderness with no rebound guarding or rigidity  Musculoskeletal:        General: No swelling.     Cervical back: Neck supple.  Skin:    General: Skin is warm and dry.     Capillary Refill: Capillary refill takes less than 2 seconds.  Neurological:     General: No focal deficit present.     Mental Status: She is alert.  Psychiatric:        Mood and Affect: Mood normal.     ED Results / Procedures / Treatments   Labs (all labs ordered are listed, but only abnormal results are displayed) Labs Reviewed  COMPREHENSIVE METABOLIC PANEL - Abnormal; Notable for the following components:      Result Value   Potassium 3.1 (*)    Glucose, Bld 103 (*)    GFR, Estimated 59 (*)    All other components within normal limits  URINALYSIS, ROUTINE W REFLEX MICROSCOPIC - Abnormal; Notable for the following components:   Leukocytes,Ua SMALL (*)    All other components within normal limits  LIPASE, BLOOD  CBC    EKG None  Radiology CT ABDOMEN PELVIS W CONTRAST  Result Date: 03/31/2022 CLINICAL DATA:  Lower abdominal pain for 6 days. EXAM: CT ABDOMEN AND PELVIS WITH CONTRAST TECHNIQUE: Multidetector CT imaging of the abdomen and pelvis was performed using the standard protocol following bolus administration of intravenous contrast. RADIATION DOSE REDUCTION: This exam was performed according to the departmental dose-optimization program which includes automated exposure  control, adjustment of the mA and/or kV according to patient size and/or use of iterative reconstruction technique. CONTRAST:  100 mL of OMNIPAQUE IOHEXOL 300 MG/ML  SOLN COMPARISON:  03/26/2022. FINDINGS: Lower chest: Large hiatal hernia containing most of the stomach. No evidence of gastric outlet obstruction. No acute findings at the lung bases. Hepatobiliary: No focal liver abnormality is seen. No gallstones, gallbladder wall thickening, or biliary dilatation. Pancreas: Unremarkable. No pancreatic ductal dilatation or surrounding inflammatory changes. Spleen: Normal in size without focal abnormality. Adrenals/Urinary Tract: Normal adrenal glands. Stable renal cysts bilaterally. No follow-up recommended. Tiny nonobstructing stone, lower pole the left kidney. No hydronephrosis. Ureters are normal in course and in caliber. Bladder minimally distended, otherwise unremarkable. Stomach/Bowel: Wall thickening with mild adjacent inflammatory stranding and haziness, distal sigmoid colon. Numerous left colon diverticula. No other areas of colonic wall thickening or inflammation. Mild prominence of the right colon and transverse colon with air-fluid levels. Small bowel is normal in caliber. No wall thickening or inflammation. Vascular/Lymphatic: Aortic atherosclerosis. No aneurysm. No enlarged lymph nodes. Reproductive: Uterus normal in size. Increased attenuation material conforms to the upper uterine endometrium, nonspecific. Contour bulge along the posterior aspect of the lower uterine segment is consistent with a small subserosal fibroid. No adnexal masses. Other: No ascites. Fat containing supraumbilical midline hernia. No bowel enters this. This is also stable. Musculoskeletal: No fracture or acute finding. No bone lesion. Advanced degenerative changes throughout the visualized spine. IMPRESSION: 1. Since the prior CT, distal sigmoid colon diverticulitis has become apparent. Diverticulitis is uncomplicated with no  extraluminal or free air and no evidence of an abscess. No evidence of bowel obstruction. Mild prominence of the right colon with air-fluid levels suggest a mild colonic adynamic ileus. 2. No other acute abnormality within the abdomen or pelvis. 3. Large hiatal hernia. Fat containing midline hernia. Numerous colonic diverticula. Presumed uterine fibroids. Aortic atherosclerosis. Electronically Signed   By: Lajean Manes M.D.   On: 03/31/2022 17:03    Procedures Procedures    Medications Ordered in ED Medications  iohexol (OMNIPAQUE) 300 MG/ML solution 100 mL (75 mLs Intravenous Contrast Given 03/31/22 1638)  potassium chloride SA (KLOR-CON M) CR tablet 40 mEq (40 mEq Oral Given 03/31/22 1747)    ED Course/ Medical Decision Making/ A&P                             Medical Decision Making This patient presents to the ED for concern of abdominal pain, this involves an extensive number of treatment options, and is a complaint that carries with it a high risk of  complications and morbidity.  The differential diagnosis includes reticulitis, colitis, abscess, intestinal perforation, other    Additional history obtained:  Additional history obtained from EMR External records from outside source obtained and reviewed including ED visit at [facility] where patient was diagnosed with stercoral colitis  Also looked at patient's medication bottles from her PCP she is on Cipro and Flagyl scribed 3 days ago   Lab Tests:  I Ordered, and personally interpreted labs.  The pertinent results include: CBC shows improvement of leukocytosis from 13 several days ago to 6.7 today.  BUN and creatinine are normal, potassium is low at 3.1, repleted orally today.  No UTI   Imaging Studies ordered:  I ordered imaging studies including CT abdomen pelvis I independently visualized and interpreted imaging which showed diverticulitis without abscess or perforation I agree with the radiologist  interpretation   Cardiac Monitoring: / EKG:  The patient was maintained on a cardiac monitor.  I personally viewed and interpreted the cardiac monitored which showed an underlying rhythm of: Sinus rhythm   Problem List / ED Course / Critical interventions / Medication management  Reticulitis-patient repeat CT today shows diverticulitis rather than stercoral colitis.  Her PCP started her on appropriate medication several days ago.  Her symptoms are improving.  Discussed continuing her medications which she is only been on for 3 days and follow with her PCP and GI.  Plan offer of pain medication states she is feeling well at this time.  She was given potassium to replete her hypokalemia   Amount and/or Complexity of Data Reviewed Labs: ordered. Radiology: ordered.  Risk Prescription drug management.          Final Clinical Impression(s) / ED Diagnoses Final diagnoses:  Diverticulitis large intestine w/o perforation or abscess w/o bleeding  Hypokalemia    Rx / DC Orders ED Discharge Orders     None         Darci Current 03/31/22 1757    Milton Ferguson, MD 04/01/22 1218

## 2022-03-31 NOTE — ED Triage Notes (Signed)
Pt reports she was seen on 2/12 at [facility] and was diagnosed with stercoral colitis and was not prescribed any medications.  Pt reports she followed up with her PCP and was given antibiotics on Wednesday but does not feel better.

## 2022-04-04 ENCOUNTER — Telehealth: Payer: Self-pay

## 2022-04-04 NOTE — Telephone Encounter (Signed)
        Patient  visited [facility] on 03/31/2022  for abdominal pain.   Telephone encounter attempt :  1st  A HIPAA compliant voice message was left requesting a return call.  Instructed patient to call back at [phone].   [place] Resource Care Guide   [email or web address]  ?? [email or web address]   Website: [email or web address]  [email or web address]

## 2022-04-10 ENCOUNTER — Telehealth: Payer: Self-pay

## 2022-04-10 NOTE — Telephone Encounter (Signed)
        Patient  visited [facility] on 03/31/2022  for abdominal pain.   Telephone encounter attempt :  2nd  A HIPAA compliant voice message was left requesting a return call.  Instructed patient to call back at [phone].   [place] Resource Care Guide   [email or web address]  ?? [id number]   Website: [email or web address]  [email or web address]

## 2022-04-11 ENCOUNTER — Telehealth: Payer: Self-pay

## 2022-04-11 NOTE — Telephone Encounter (Signed)
        Patient  visited [facility] on 03/31/2022  for abdominal pain.   Telephone encounter attempt :  3rd  A HIPAA compliant voice message was left requesting a return call.  Instructed patient to call back at [phone].   [place] Resource Care Guide   [email or web address]  ?? [id number]   Website: [email or web address]  [email or web address]

## 2022-04-12 ENCOUNTER — Encounter: Payer: Self-pay | Admitting: Internal Medicine

## 2022-04-19 DIAGNOSIS — Z299 Encounter for prophylactic measures, unspecified: Secondary | ICD-10-CM | POA: Diagnosis not present

## 2022-04-19 DIAGNOSIS — M549 Dorsalgia, unspecified: Secondary | ICD-10-CM | POA: Diagnosis not present

## 2022-04-19 DIAGNOSIS — J9611 Chronic respiratory failure with hypoxia: Secondary | ICD-10-CM | POA: Diagnosis not present

## 2022-04-19 DIAGNOSIS — I10 Essential (primary) hypertension: Secondary | ICD-10-CM | POA: Diagnosis not present

## 2022-04-27 NOTE — Progress Notes (Deleted)
Referring Provider: [facility] Primary Care Physician:  Monico Blitz, MD Primary Gastroenterologist:  Dr. Gala Romney  No chief complaint on file.   HPI:   Mary Solis is a 82 y.o. female presenting today at the request of [place] for abdominal pain.   Patient was seen in the emergency room 03/26/2022 at [facility] for lower abdominal cramping with gas.  CT A/P with IV contrast with sigmoid colon packed with stool, abrupt caliber change in the mid to distal sigmoid colon that could indicate chronic stricture, mild edema around the sigmoid colon proximal to that which could be due to diverticulitis or stercoral colitis with stercoral colitis favored.  Also with large hiatal hernia containing the majority of the stomach.  She was advised to follow-up with PCP to discuss a colonoscopy.  She was not given any antibiotics.  Patient was seen in the emergency room 03/31/2022 for abdominal pain.  She reported PCP had started her on Cipro and Flagyl since her prior ER evaluation at [facility].  CT A/P with contrast showed distal sigmoid colon diverticulitis, uncomplicated, mild prominence of the right colon with air-fluid levels suggesting mild colonic adynamic ileus.  She was advised to complete the antibiotic course that her PCP had prescribed, follow-up with PCP and GI.  Today:   Prior colonoscopy:   No past medical history on file.  No past surgical history on file.  Current Outpatient Medications  Medication Sig Dispense Refill   albuterol (PROVENTIL) (2.5 MG/3ML) 0.083% nebulizer solution Use up  to every 4 hours if needed 75 mL 11   aspirin 325 MG EC tablet Take 325 mg by mouth daily.     Cranberry 1000 MG CAPS Take 1 capsule by mouth daily.     fexofenadine (ALLEGRA) 180 MG tablet Take 180 mg by mouth daily.     fluticasone furoate-vilanterol (BREO ELLIPTA) 100-25 MCG/INH AEPB Inhale 1 puff into the lungs daily.     No current facility-administered medications for this  visit.    Allergies as of 04/30/2022 - Review Complete 03/31/2022  Allergen Reaction Noted   Ibuprofen Itching 05/11/2017    No family history on file.  Social History   Socioeconomic History   Marital status: Widowed    Spouse name: Not on file   Number of children: Not on file   Years of education: Not on file   Highest education level: Not on file  Occupational History   Not on file  Tobacco Use   Smoking status: Never   Smokeless tobacco: Never  Vaping Use   Vaping Use: Never used  Substance and Sexual Activity   Alcohol use: Never   Drug use: Never   Sexual activity: Not on file  Other Topics Concern   Not on file  Social History Narrative   Not on file   Social Determinants of Health   Financial Resource Strain: Not on file  Food Insecurity: Not on file  Transportation Needs: Not on file  Physical Activity: Not on file  Stress: Not on file  Social Connections: Not on file  Intimate Partner Violence: Not on file    Review of Systems: Gen: Denies any fever, chills, fatigue, weight loss, lack of appetite.  CV: Denies chest pain, heart palpitations, peripheral edema, syncope.  Resp: Denies shortness of breath at rest or with exertion. Denies wheezing or cough.  GI: Denies dysphagia or odynophagia. Denies jaundice, hematemesis, fecal incontinence. GU : Denies urinary burning, urinary frequency, urinary hesitancy MS:  Denies joint pain, muscle weakness, cramps, or limitation of movement.  Derm: Denies rash, itching, dry skin Psych: Denies depression, anxiety, memory loss, and confusion Heme: Denies bruising, bleeding, and enlarged lymph nodes.  Physical Exam: There were no vitals taken for this visit. General:   Alert and oriented. Pleasant and cooperative. Well-nourished and well-developed.  Head:  Normocephalic and atraumatic. Eyes:  Without icterus, sclera clear and conjunctiva pink.  Ears:  Normal auditory acuity. Lungs:  Clear to auscultation  bilaterally. No wheezes, rales, or rhonchi. No distress.  Heart:  S1, S2 present without murmurs appreciated.  Abdomen:  +BS, soft, non-tender and non-distended. No HSM noted. No guarding or rebound. No masses appreciated.  Rectal:  Deferred  Msk:  Symmetrical without gross deformities. Normal posture. Extremities:  Without edema. Neurologic:  Alert and  oriented x4;  grossly normal neurologically. Skin:  Intact without significant lesions or rashes. Psych:  Alert and cooperative. Normal mood and affect.    Assessment:     Plan:  ***   Aliene Altes, PA-C [facility] 04/30/2022

## 2022-04-30 ENCOUNTER — Ambulatory Visit: Payer: Medicare HMO | Admitting: Gastroenterology

## 2022-05-02 ENCOUNTER — Encounter: Payer: Self-pay | Admitting: Gastroenterology

## 2022-05-14 IMAGING — DX DG CHEST 2V
2 series · 2 of 2 positions shown · non-contrast
Comparison: 08/19/2019, 07/21/2019

CLINICAL DATA: Cough, history of asthma

EXAM:
CHEST - 2 VIEW

[chest pa]
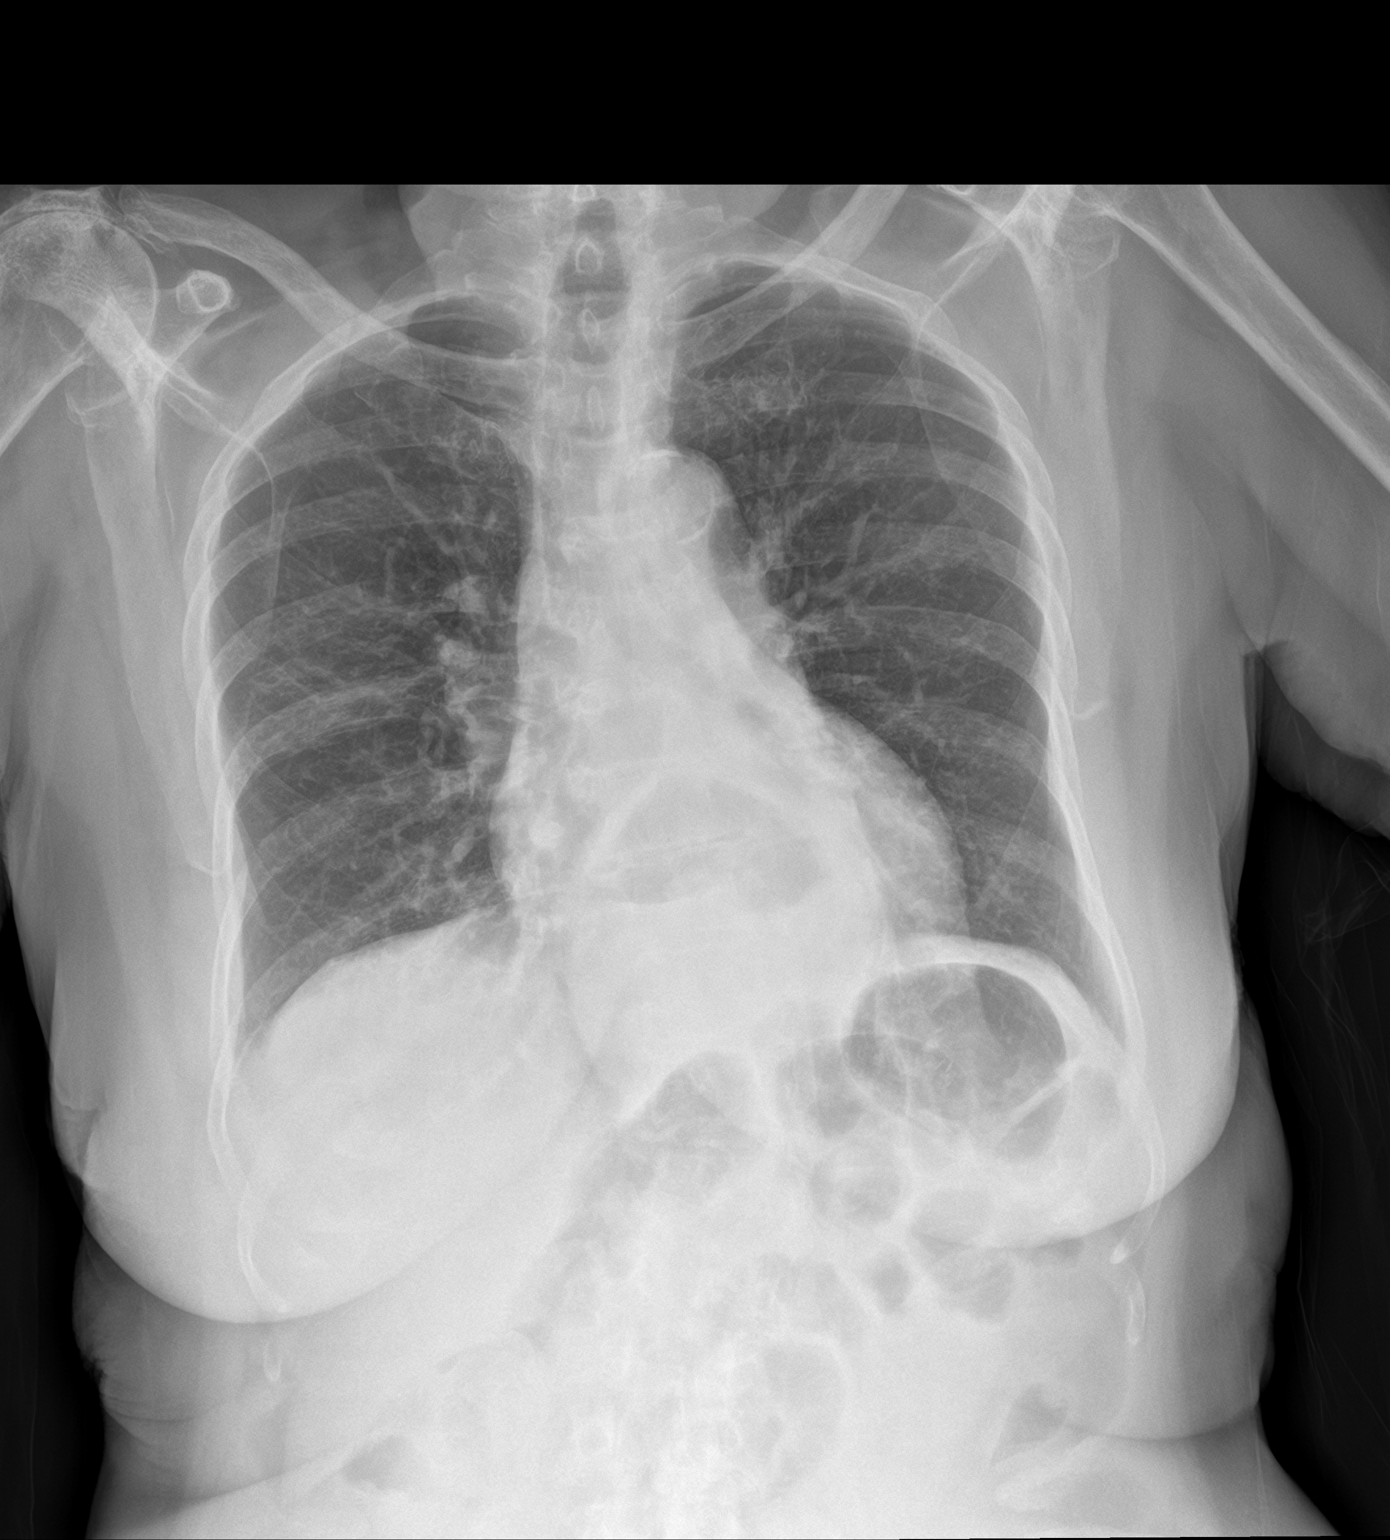

[chest lat]
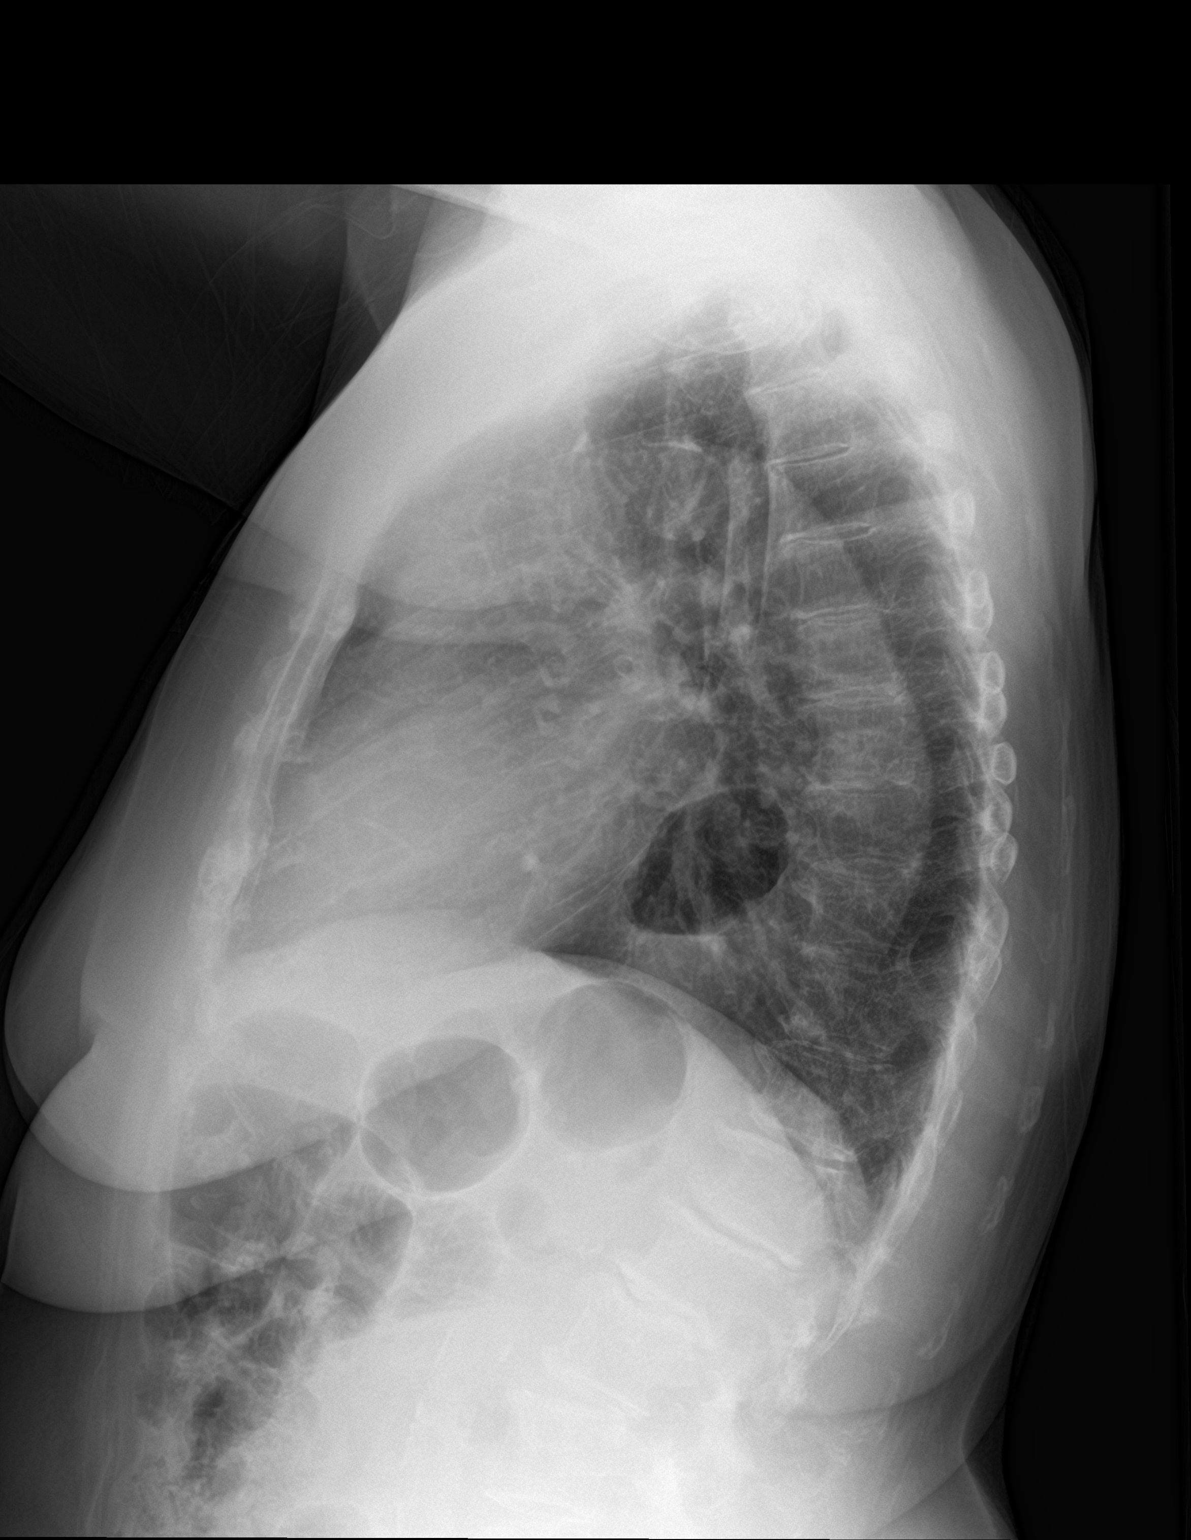

[2 of 2 positions shown; findings below may reference images not displayed]

FINDINGS: Stable heart size. Atherosclerotic calcification of the aortic knob.
Moderate to large hiatal hernia. Interval resolution of previously
seen airspace opacities. No new focal airspace consolidation. No
pleural effusion or pneumothorax. Advanced degenerative changes of
the bilateral shoulders. Multilevel degenerative changes of the
thoracolumbar spine.
IMPRESSION: 1. Interval resolution of previously seen airspace opacities. No
acute cardiopulmonary findings.
2. Moderate to large hiatal hernia.

## 2022-05-15 DIAGNOSIS — E559 Vitamin D deficiency, unspecified: Secondary | ICD-10-CM | POA: Diagnosis not present

## 2022-05-15 DIAGNOSIS — Z7189 Other specified counseling: Secondary | ICD-10-CM | POA: Diagnosis not present

## 2022-05-15 DIAGNOSIS — Z0000 Encounter for general adult medical examination without abnormal findings: Secondary | ICD-10-CM | POA: Diagnosis not present

## 2022-05-15 DIAGNOSIS — Z299 Encounter for prophylactic measures, unspecified: Secondary | ICD-10-CM | POA: Diagnosis not present

## 2022-05-15 DIAGNOSIS — Z1331 Encounter for screening for depression: Secondary | ICD-10-CM | POA: Diagnosis not present

## 2022-05-15 DIAGNOSIS — R5383 Other fatigue: Secondary | ICD-10-CM | POA: Diagnosis not present

## 2022-05-15 DIAGNOSIS — Z1339 Encounter for screening examination for other mental health and behavioral disorders: Secondary | ICD-10-CM | POA: Diagnosis not present

## 2022-05-15 DIAGNOSIS — I10 Essential (primary) hypertension: Secondary | ICD-10-CM | POA: Diagnosis not present

## 2022-05-15 DIAGNOSIS — E7800 Pure hypercholesterolemia, unspecified: Secondary | ICD-10-CM | POA: Diagnosis not present

## 2022-05-15 DIAGNOSIS — Z79899 Other long term (current) drug therapy: Secondary | ICD-10-CM | POA: Diagnosis not present

## 2022-06-15 DIAGNOSIS — E2839 Other primary ovarian failure: Secondary | ICD-10-CM | POA: Diagnosis not present

## 2022-09-13 DIAGNOSIS — I10 Essential (primary) hypertension: Secondary | ICD-10-CM | POA: Diagnosis not present

## 2022-09-13 DIAGNOSIS — Z0000 Encounter for general adult medical examination without abnormal findings: Secondary | ICD-10-CM | POA: Diagnosis not present

## 2022-09-13 DIAGNOSIS — I700 Atherosclerosis of aorta: Secondary | ICD-10-CM | POA: Diagnosis not present

## 2022-09-13 DIAGNOSIS — Z299 Encounter for prophylactic measures, unspecified: Secondary | ICD-10-CM | POA: Diagnosis not present

## 2023-01-14 DIAGNOSIS — I700 Atherosclerosis of aorta: Secondary | ICD-10-CM | POA: Diagnosis not present

## 2023-01-14 DIAGNOSIS — R52 Pain, unspecified: Secondary | ICD-10-CM | POA: Diagnosis not present

## 2023-01-14 DIAGNOSIS — I10 Essential (primary) hypertension: Secondary | ICD-10-CM | POA: Diagnosis not present

## 2023-01-14 DIAGNOSIS — J9611 Chronic respiratory failure with hypoxia: Secondary | ICD-10-CM | POA: Diagnosis not present

## 2023-01-14 DIAGNOSIS — R252 Cramp and spasm: Secondary | ICD-10-CM | POA: Diagnosis not present

## 2023-01-14 DIAGNOSIS — Z299 Encounter for prophylactic measures, unspecified: Secondary | ICD-10-CM | POA: Diagnosis not present

## 2023-03-21 DIAGNOSIS — I10 Essential (primary) hypertension: Secondary | ICD-10-CM | POA: Diagnosis not present

## 2023-03-21 DIAGNOSIS — Z299 Encounter for prophylactic measures, unspecified: Secondary | ICD-10-CM | POA: Diagnosis not present

## 2023-03-21 DIAGNOSIS — R252 Cramp and spasm: Secondary | ICD-10-CM | POA: Diagnosis not present

## 2023-03-21 DIAGNOSIS — M1710 Unilateral primary osteoarthritis, unspecified knee: Secondary | ICD-10-CM | POA: Diagnosis not present

## 2023-04-07 DIAGNOSIS — J449 Chronic obstructive pulmonary disease, unspecified: Secondary | ICD-10-CM | POA: Diagnosis not present

## 2023-05-05 DIAGNOSIS — J449 Chronic obstructive pulmonary disease, unspecified: Secondary | ICD-10-CM | POA: Diagnosis not present

## 2023-05-16 DIAGNOSIS — Z0000 Encounter for general adult medical examination without abnormal findings: Secondary | ICD-10-CM | POA: Diagnosis not present

## 2023-05-16 DIAGNOSIS — Z7189 Other specified counseling: Secondary | ICD-10-CM | POA: Diagnosis not present

## 2023-05-16 DIAGNOSIS — Z79899 Other long term (current) drug therapy: Secondary | ICD-10-CM | POA: Diagnosis not present

## 2023-05-16 DIAGNOSIS — E559 Vitamin D deficiency, unspecified: Secondary | ICD-10-CM | POA: Diagnosis not present

## 2023-05-16 DIAGNOSIS — E7800 Pure hypercholesterolemia, unspecified: Secondary | ICD-10-CM | POA: Diagnosis not present

## 2023-05-16 DIAGNOSIS — Z299 Encounter for prophylactic measures, unspecified: Secondary | ICD-10-CM | POA: Diagnosis not present

## 2023-05-16 DIAGNOSIS — R5383 Other fatigue: Secondary | ICD-10-CM | POA: Diagnosis not present

## 2023-05-16 DIAGNOSIS — I10 Essential (primary) hypertension: Secondary | ICD-10-CM | POA: Diagnosis not present

## 2023-06-05 DIAGNOSIS — J449 Chronic obstructive pulmonary disease, unspecified: Secondary | ICD-10-CM | POA: Diagnosis not present

## 2023-06-27 DIAGNOSIS — Z0000 Encounter for general adult medical examination without abnormal findings: Secondary | ICD-10-CM | POA: Diagnosis not present

## 2023-06-27 DIAGNOSIS — J45909 Unspecified asthma, uncomplicated: Secondary | ICD-10-CM | POA: Diagnosis not present

## 2023-06-27 DIAGNOSIS — Z299 Encounter for prophylactic measures, unspecified: Secondary | ICD-10-CM | POA: Diagnosis not present

## 2023-06-27 DIAGNOSIS — I10 Essential (primary) hypertension: Secondary | ICD-10-CM | POA: Diagnosis not present

## 2023-06-27 DIAGNOSIS — I700 Atherosclerosis of aorta: Secondary | ICD-10-CM | POA: Diagnosis not present

## 2023-07-05 DIAGNOSIS — J449 Chronic obstructive pulmonary disease, unspecified: Secondary | ICD-10-CM | POA: Diagnosis not present

## 2023-08-05 DIAGNOSIS — J449 Chronic obstructive pulmonary disease, unspecified: Secondary | ICD-10-CM | POA: Diagnosis not present

## 2023-08-19 DIAGNOSIS — Z299 Encounter for prophylactic measures, unspecified: Secondary | ICD-10-CM | POA: Diagnosis not present

## 2023-08-19 DIAGNOSIS — I10 Essential (primary) hypertension: Secondary | ICD-10-CM | POA: Diagnosis not present

## 2023-08-19 DIAGNOSIS — J45909 Unspecified asthma, uncomplicated: Secondary | ICD-10-CM | POA: Diagnosis not present

## 2023-08-19 DIAGNOSIS — R0602 Shortness of breath: Secondary | ICD-10-CM | POA: Diagnosis not present

## 2023-09-04 DIAGNOSIS — J449 Chronic obstructive pulmonary disease, unspecified: Secondary | ICD-10-CM | POA: Diagnosis not present

## 2023-09-27 DIAGNOSIS — Z299 Encounter for prophylactic measures, unspecified: Secondary | ICD-10-CM | POA: Diagnosis not present

## 2023-09-27 DIAGNOSIS — I10 Essential (primary) hypertension: Secondary | ICD-10-CM | POA: Diagnosis not present

## 2023-09-27 DIAGNOSIS — J45909 Unspecified asthma, uncomplicated: Secondary | ICD-10-CM | POA: Diagnosis not present

## 2023-09-27 DIAGNOSIS — R519 Headache, unspecified: Secondary | ICD-10-CM | POA: Diagnosis not present

## 2023-10-05 DIAGNOSIS — J449 Chronic obstructive pulmonary disease, unspecified: Secondary | ICD-10-CM | POA: Diagnosis not present

## 2023-10-23 DIAGNOSIS — J45909 Unspecified asthma, uncomplicated: Secondary | ICD-10-CM | POA: Diagnosis not present

## 2023-10-23 DIAGNOSIS — R252 Cramp and spasm: Secondary | ICD-10-CM | POA: Diagnosis not present

## 2023-10-23 DIAGNOSIS — Z299 Encounter for prophylactic measures, unspecified: Secondary | ICD-10-CM | POA: Diagnosis not present

## 2023-10-23 DIAGNOSIS — I10 Essential (primary) hypertension: Secondary | ICD-10-CM | POA: Diagnosis not present

## 2023-10-23 DIAGNOSIS — L259 Unspecified contact dermatitis, unspecified cause: Secondary | ICD-10-CM | POA: Diagnosis not present

## 2023-10-24 DIAGNOSIS — R059 Cough, unspecified: Secondary | ICD-10-CM | POA: Diagnosis not present

## 2023-10-24 DIAGNOSIS — R058 Other specified cough: Secondary | ICD-10-CM | POA: Diagnosis not present

## 2023-10-24 DIAGNOSIS — K449 Diaphragmatic hernia without obstruction or gangrene: Secondary | ICD-10-CM | POA: Diagnosis not present

## 2023-11-05 DIAGNOSIS — J449 Chronic obstructive pulmonary disease, unspecified: Secondary | ICD-10-CM | POA: Diagnosis not present

## 2023-11-13 DIAGNOSIS — R21 Rash and other nonspecific skin eruption: Secondary | ICD-10-CM | POA: Diagnosis not present

## 2023-11-13 DIAGNOSIS — J45909 Unspecified asthma, uncomplicated: Secondary | ICD-10-CM | POA: Diagnosis not present

## 2023-11-13 DIAGNOSIS — Z299 Encounter for prophylactic measures, unspecified: Secondary | ICD-10-CM | POA: Diagnosis not present

## 2023-11-13 DIAGNOSIS — I10 Essential (primary) hypertension: Secondary | ICD-10-CM | POA: Diagnosis not present

## 2023-11-13 DIAGNOSIS — R252 Cramp and spasm: Secondary | ICD-10-CM | POA: Diagnosis not present

## 2023-11-20 DIAGNOSIS — I739 Peripheral vascular disease, unspecified: Secondary | ICD-10-CM | POA: Diagnosis not present

## 2023-11-20 DIAGNOSIS — M79672 Pain in left foot: Secondary | ICD-10-CM | POA: Diagnosis not present

## 2023-11-20 DIAGNOSIS — M79671 Pain in right foot: Secondary | ICD-10-CM | POA: Diagnosis not present

## 2023-11-20 DIAGNOSIS — L110 Acquired keratosis follicularis: Secondary | ICD-10-CM | POA: Diagnosis not present

## 2023-12-05 DIAGNOSIS — J449 Chronic obstructive pulmonary disease, unspecified: Secondary | ICD-10-CM | POA: Diagnosis not present
